# Patient Record
Sex: Male | Born: 1995 | Race: Black or African American | Hispanic: No | Marital: Married | Smoking: Never smoker
Health system: Southern US, Community
[De-identification: ages and names within clinical notes are randomized; demographics above are authoritative.]

---
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
## 2013-10-27 ENCOUNTER — Emergency Department (HOSPITAL_COMMUNITY)
Admission: EM | Admit: 2013-10-27 | Discharge: 2013-10-27 | Disposition: A | Discharge status: Home / Self Care | Payer: Medicaid Other | Attending: Emergency Medicine | Admitting: Emergency Medicine

## 2013-10-27 ENCOUNTER — Emergency Department (HOSPITAL_COMMUNITY): Payer: Medicaid Other

## 2013-10-27 ENCOUNTER — Encounter (HOSPITAL_COMMUNITY): Payer: Self-pay | Admitting: Emergency Medicine

## 2013-10-27 DIAGNOSIS — R109 Unspecified abdominal pain: Secondary | ICD-10-CM

## 2013-10-27 DIAGNOSIS — Z79899 Other long term (current) drug therapy: Secondary | ICD-10-CM | POA: Insufficient documentation

## 2013-10-27 DIAGNOSIS — K5900 Constipation, unspecified: Secondary | ICD-10-CM

## 2013-10-27 DIAGNOSIS — R000 Tachycardia, unspecified: Secondary | ICD-10-CM

## 2013-10-27 LAB — COMPREHENSIVE METABOLIC PANEL
ALBUMIN: 3.4 g/dL — AB (ref 3.5–5.2)
ALK PHOS: 66 U/L (ref 39–117)
ALT: 10 U/L (ref 0–53)
AST: 14 U/L (ref 0–37)
BUN: 11 mg/dL (ref 6–23)
CO2: 25 mEq/L (ref 19–32)
Calcium: 9.5 mg/dL (ref 8.4–10.5)
Chloride: 100 mEq/L (ref 96–112)
Creatinine, Ser: 0.95 mg/dL (ref 0.50–1.35)
GFR calc non Af Amer: >90 mL/min (ref >90)
Glucose, Bld: 117 mg/dL — ABNORMAL HIGH (ref 70–99)
POTASSIUM: 4.5 meq/L (ref 3.7–5.3)
SODIUM: 139 meq/L (ref 137–147)
TOTAL PROTEIN: 8 g/dL (ref 6.0–8.3)
Total Bilirubin: 0.6 mg/dL (ref 0.3–1.2)

## 2013-10-27 LAB — CBC WITH DIFFERENTIAL/PLATELET
BASOS PCT: 0 % (ref 0–1)
Basophils Absolute: 0 10*3/uL (ref 0.0–0.1)
Eosinophils Absolute: 0 10*3/uL (ref 0.0–0.7)
Eosinophils Relative: 1 % (ref 0–5)
HEMATOCRIT: 40.3 % (ref 39.0–52.0)
Hemoglobin: 13.5 g/dL (ref 13.0–17.0)
LYMPHS PCT: 30 % (ref 12–46)
Lymphs Abs: 2.6 10*3/uL (ref 0.7–4.0)
MCH: 27.5 pg (ref 26.0–34.0)
MCHC: 33.5 g/dL (ref 30.0–36.0)
MCV: 82.1 fL (ref 78.0–100.0)
MONO ABS: 0.8 10*3/uL (ref 0.1–1.0)
MONOS PCT: 10 % (ref 3–12)
NEUTROS ABS: 5.2 10*3/uL (ref 1.7–7.7)
Neutrophils Relative %: 59 % (ref 43–77)
Platelets: 271 10*3/uL (ref 150–400)
RBC: 4.91 MIL/uL (ref 4.22–5.81)
RDW: 14.9 % (ref 11.5–15.5)
WBC: 8.8 10*3/uL (ref 4.0–10.5)

## 2013-10-27 LAB — URINALYSIS, ROUTINE W REFLEX MICROSCOPIC
Bilirubin Urine: NEGATIVE
Glucose, UA: NEGATIVE mg/dL
HGB URINE DIPSTICK: NEGATIVE
KETONES UR: NEGATIVE mg/dL
Leukocytes, UA: NEGATIVE
Nitrite: NEGATIVE
PH: 6 (ref 5.0–8.0)
Protein, ur: NEGATIVE mg/dL
SPECIFIC GRAVITY, URINE: 1.023 (ref 1.005–1.030)
Urobilinogen, UA: 1 mg/dL (ref 0.0–1.0)

## 2013-10-27 MED ORDER — IOHEXOL 300 MG/ML  SOLN
25.0000 mL | Freq: Once | INTRAMUSCULAR | Status: AC | PRN
  Administered 2013-10-27: 25 mL via ORAL

## 2013-10-27 MED ORDER — SODIUM CHLORIDE 0.9 % IV BOLUS (SEPSIS)
500.0000 mL | Freq: Once | INTRAVENOUS | Status: AC
  Administered 2013-10-27: 500 mL via INTRAVENOUS

## 2013-10-27 MED ORDER — IOHEXOL 300 MG/ML  SOLN
100.0000 mL | Freq: Once | INTRAMUSCULAR | Status: AC | PRN
  Administered 2013-10-27: 100 mL via INTRAVENOUS

## 2013-10-27 MED ORDER — POLYETHYLENE GLYCOL 3350 17 G PO PACK: 17.0000 g | PACK | Freq: Every day | ORAL | Status: DC

## 2013-10-27 NOTE — Discharge Instructions (Signed)
Abdominal Pain, Adult °Many things can cause abdominal pain. Usually, abdominal pain is not caused by a disease and will improve without treatment. It can often be observed and treated at home. Your health care provider will do a physical exam and possibly order blood tests and X-rays to help determine the seriousness of your pain. However, in many cases, more time must pass before a clear cause of the pain can be found. Before that point, your health care provider may not know if you need more testing or further treatment. °HOME CARE INSTRUCTIONS  °Monitor your abdominal pain for any changes. The following actions may help to alleviate any discomfort you are experiencing: °· Only take over-the-counter or prescription medicines as directed by your health care provider. °· Do not take laxatives unless directed to do so by your health care provider. °· Try a clear liquid diet (broth, tea, or water) as directed by your health care provider. Slowly move to a bland diet as tolerated. °SEEK MEDICAL CARE IF: °· You have unexplained abdominal pain. °· You have abdominal pain associated with nausea or diarrhea. °· You have pain when you urinate or have a bowel movement. °· You experience abdominal pain that wakes you in the night. °· You have abdominal pain that is worsened or improved by eating food. °· You have abdominal pain that is worsened with eating fatty foods. °SEEK IMMEDIATE MEDICAL CARE IF:  °· Your pain does not go away within 2 hours. °· You have a fever. °· You keep throwing up (vomiting). °· Your pain is felt only in portions of the abdomen, such as the right side or the left lower portion of the abdomen. °· You pass bloody or black tarry stools. °MAKE SURE YOU: °· Understand these instructions.   °· Will watch your condition.   °· Will get help right away if you are not doing well or get worse.   °Document Released: 04/18/2005 Document Revised: 04/29/2013 Document Reviewed: 03/18/2013 °[employer]® Patient  Information ©2014 [employer]. °Constipation, Adult °Constipation is when a person has fewer than 3 bowel movements a week; has difficulty having a bowel movement; or has stools that are dry, hard, or larger than normal. As people grow older, constipation is more common. If you try to fix constipation with medicines that make you have a bowel movement (laxatives), the problem may get worse. Long-term laxative use may cause the muscles of the colon to become weak. A low-fiber diet, not taking in enough fluids, and taking certain medicines may make constipation worse. °CAUSES  °· Certain medicines, such as antidepressants, pain medicine, iron supplements, antacids, and water pills.   °· Certain diseases, such as diabetes, irritable bowel syndrome (IBS), thyroid disease, or depression.   °· Not drinking enough water.   °· Not eating enough fiber-rich foods.   °· Stress or travel. °· Lack of physical activity or exercise. °· Not going to the restroom when there is the urge to have a bowel movement. °· Ignoring the urge to have a bowel movement. °· Using laxatives too much. °SYMPTOMS  °· Having fewer than 3 bowel movements a week.   °· Straining to have a bowel movement.   °· Having hard, dry, or larger than normal stools.   °· Feeling full or bloated.   °· Pain in the lower abdomen. °· Not feeling relief after having a bowel movement. °DIAGNOSIS  °Your caregiver will take a medical history and perform a physical exam. Further testing may be done for severe constipation. Some tests may include:  °· A barium enema   X-ray to examine your rectum, colon, and sometimes, your small intestine. °· A sigmoidoscopy to examine your lower colon. °· A colonoscopy to examine your entire colon. °TREATMENT  °Treatment will depend on the severity of your constipation and what is causing it. Some dietary treatments include drinking more fluids and eating more fiber-rich foods. Lifestyle treatments may include regular exercise. If these  diet and lifestyle recommendations do not help, your caregiver may recommend taking over-the-counter laxative medicines to help you have bowel movements. Prescription medicines may be prescribed if over-the-counter medicines do not work.  °HOME CARE INSTRUCTIONS  °· Increase dietary fiber in your diet, such as fruits, vegetables, whole grains, and beans. Limit high-fat and processed sugars in your diet, such as French fries, hamburgers, cookies, candies, and soda.   °· A fiber supplement may be added to your diet if you cannot get enough fiber from foods.   °· Drink enough fluids to keep your urine clear or pale yellow.   °· Exercise regularly or as directed by your caregiver.   °· Go to the restroom when you have the urge to go. Do not hold it. °· Only take medicines as directed by your caregiver. Do not take other medicines for constipation without talking to your caregiver first. °SEEK IMMEDIATE MEDICAL CARE IF:  °· You have bright red blood in your stool.   °· Your constipation lasts for more than 4 days or gets worse.   °· You have abdominal or rectal pain.   °· You have thin, pencil-like stools. °· You have unexplained weight loss. °MAKE SURE YOU:  °· Understand these instructions. °· Will watch your condition. °· Will get help right away if you are not doing well or get worse. °Document Released: 04/06/2004 Document Revised: 10/01/2011 Document Reviewed: 04/20/2013 °[employer]® Patient Information ©2014 [employer]. ° °

## 2013-10-27 NOTE — ED Notes (Signed)
Pt to CT

## 2013-10-27 NOTE — ED Provider Notes (Signed)
CSN: [id number]     Arrival date & time 10/27/13  1238 History   First MD Initiated Contact with Patient 10/27/13 1707     Chief Complaint  Patient presents with  . Abdominal Pain     (Consider location/radiation/quality/duration/timing/severity/associated sxs/prior Treatment) Patient is a 18 y.o. male presenting with abdominal pain. The history is provided by the patient.  Abdominal Pain Associated symptoms: no chest pain, no diarrhea, no nausea, no shortness of breath and no vomiting    patient has lower abdominal pain for last 2 days. Worse with movement. No nausea vomiting diarrhea. No fevers. No dysuria. No change in bowel habits. No trauma, however states he did workout at gym today. He's not specifically to abdominal muscles, but states he was working on free weights. He's not had pain like this before.  History reviewed. No pertinent past medical history. History reviewed. No pertinent past surgical history. No family history on file. History  Substance Use Topics  . Smoking status: Never Smoker   . Smokeless tobacco: Not on file  . Alcohol Use: No    Review of Systems  Constitutional: Negative for activity change and appetite change.  Eyes: Negative for pain.  Respiratory: Negative for chest tightness and shortness of breath.   Cardiovascular: Negative for chest pain and leg swelling.  Gastrointestinal: Positive for abdominal pain. Negative for nausea, vomiting and diarrhea.  Genitourinary: Negative for flank pain.  Musculoskeletal: Negative for back pain and neck stiffness.  Skin: Negative for rash.  Neurological: Negative for weakness, numbness and headaches.  Psychiatric/Behavioral: Negative for behavioral problems.      Allergies  Review of patient's allergies indicates no known allergies.  Home Medications   Current Outpatient Rx  Name  Route  Sig  Dispense  Refill  . ibuprofen (ADVIL,MOTRIN) 200 MG tablet   Oral   Take 200 mg by mouth every 6 (six) hours  as needed for moderate pain.         . polyethylene glycol (MIRALAX / GLYCOLAX) packet   Oral   Take 17 g by mouth daily.   7 each   0    BP 125/79  Pulse 118  Temp(Src) 98.4 F (36.9 C) (Oral)  Resp 23  Ht 6\' 2"  (1.88 m)  Wt 156 lb 6.4 oz (70.943 kg)  BMI 20.07 kg/m2  SpO2 97% Physical Exam  Constitutional: He is oriented to person, place, and time. He appears well-developed and well-nourished.  HENT:  Head: Normocephalic.  Eyes: Pupils are equal, round, and reactive to light.  Cardiovascular:  tachycardia  Pulmonary/Chest: Effort normal and breath sounds normal.  Abdominal: Soft.  Moderate lower abdominal tenderness in the midline. No hernias palpated  Musculoskeletal: Normal range of motion.  Neurological: He is alert and oriented to person, place, and time.  Skin: Skin is warm.    ED Course  Procedures (including critical care time) Labs Review Labs Reviewed  COMPREHENSIVE METABOLIC PANEL - Abnormal; Notable for the following:    Glucose, Bld 117 (*)    Albumin 3.4 (*)    All other components within normal limits  CBC WITH DIFFERENTIAL  URINALYSIS, ROUTINE W REFLEX MICROSCOPIC   Imaging Review Ct Abdomen Pelvis W Contrast  10/27/2013   CLINICAL DATA:  Bilateral lower abdominal pain.  EXAM: CT ABDOMEN AND PELVIS WITH CONTRAST  TECHNIQUE: Multidetector CT imaging of the abdomen and pelvis was performed using the standard protocol following bolus administration of intravenous contrast.  CONTRAST:  100 mL OMNIPAQUE IOHEXOL 300 MG/ML  SOLN  COMPARISON:  None.  FINDINGS: Lung bases are clear.  No pleural or pericardial effusion.  The adrenal glands, liver, gallbladder, spleen, pancreas and kidneys appear normal. The appendix appears normal. There is a large volume of stool in the colon but the colon is otherwise unremarkable. Stomach and small bowel appear normal. No lymphadenopathy or fluid. No focal bony abnormality.  IMPRESSION: No acute finding.  Large colonic stool  burden.   Electronically Signed   By: Drusilla Kanner M.D.   On: 10/27/2013 18:33     EKG Interpretation None      MDM   Final diagnoses:  Abdominal pain  Constipation  Tachycardia    Patient with abdominal pain. Lower abdomen. Laboratory reassuring. Moderate tenderness a CT scan was done. Only showed large colonic stool burden. Will discharge home. Also has a tachycardia. Appears sinus on the monitor. It is improved somewhat during his stay. Will discharge home.    Juliet Rude. Rubin Payor, MD 10/27/13 1610

## 2013-10-27 NOTE — ED Notes (Signed)
Pt reports lower abd pain since Sunday, increases with movement. Denies any urinary symptoms or n/v/d.

## 2013-11-14 ENCOUNTER — Emergency Department (HOSPITAL_BASED_OUTPATIENT_CLINIC_OR_DEPARTMENT_OTHER)
Admission: EM | Admit: 2013-11-14 | Discharge: 2013-11-14 | Disposition: A | Discharge status: Home / Self Care | Payer: Medicaid Other | Attending: Emergency Medicine | Admitting: Emergency Medicine

## 2013-11-14 ENCOUNTER — Encounter (HOSPITAL_BASED_OUTPATIENT_CLINIC_OR_DEPARTMENT_OTHER): Payer: Self-pay | Admitting: Emergency Medicine

## 2013-11-14 DIAGNOSIS — K5900 Constipation, unspecified: Secondary | ICD-10-CM | POA: Insufficient documentation

## 2013-11-14 DIAGNOSIS — X58XXXA Exposure to other specified factors, initial encounter: Secondary | ICD-10-CM | POA: Insufficient documentation

## 2013-11-14 DIAGNOSIS — S76219A Strain of adductor muscle, fascia and tendon of unspecified thigh, initial encounter: Secondary | ICD-10-CM

## 2013-11-14 DIAGNOSIS — Y929 Unspecified place or not applicable: Secondary | ICD-10-CM | POA: Insufficient documentation

## 2013-11-14 DIAGNOSIS — Y939 Activity, unspecified: Secondary | ICD-10-CM | POA: Insufficient documentation

## 2013-11-14 DIAGNOSIS — Z79899 Other long term (current) drug therapy: Secondary | ICD-10-CM | POA: Insufficient documentation

## 2013-11-14 DIAGNOSIS — IMO0002 Reserved for concepts with insufficient information to code with codable children: Secondary | ICD-10-CM | POA: Insufficient documentation

## 2013-11-14 LAB — URINALYSIS, ROUTINE W REFLEX MICROSCOPIC
GLUCOSE, UA: NEGATIVE mg/dL
Hgb urine dipstick: NEGATIVE
Ketones, ur: 15 mg/dL — AB
Leukocytes, UA: NEGATIVE
Nitrite: NEGATIVE
Protein, ur: NEGATIVE mg/dL
Specific Gravity, Urine: 1.036 — ABNORMAL HIGH (ref 1.005–1.030)
Urobilinogen, UA: 1 mg/dL (ref 0.0–1.0)
pH: 6 (ref 5.0–8.0)

## 2013-11-14 NOTE — ED Notes (Signed)
Reports right side groin pain with running and situps during PT for [employer]

## 2013-11-14 NOTE — ED Provider Notes (Signed)
CSN: [id number]     Arrival date & time 11/14/13  1825 History  This chart was scribed for Rolan BuccoMelanie Alzena Gerber, MD by Beverly MilchJ Harrison Collins, ED Scribe. This patient was seen in room MH11/MH11 and the patient's care was started at 6:48 PM.    Chief Complaint  Patient presents with  . Groin Pain    The history is provided by the patient. No language interpreter was used.  HPI Comments: Guy Sutton is a 18 y.o. male who presents to the Emergency Department complaining of groin pain that began today. Pt reports he had constipation last week but is unsure that if it was related. He notices the pain worsens with movements such as sit ups and running. He states that it only hurts with these activities.  Pt denies lumps around the groin, abdominal pain, dysuria, and penile discharge.Pt denies h/o similar pain and hernias. Pt reports no pain with his BM today. Pt reports he would like a note for his physical training.   History reviewed. No pertinent past medical history. History reviewed. No pertinent past surgical history. No family history on file. History  Substance Use Topics  . Smoking status: Never Smoker   . Smokeless tobacco: Never Used  . Alcohol Use: No    Review of Systems  Constitutional: Negative for fever, chills, diaphoresis and fatigue.  HENT: Negative for congestion, rhinorrhea and sneezing.   Eyes: Negative.   Respiratory: Negative for cough, chest tightness and shortness of breath.   Cardiovascular: Negative for chest pain and leg swelling.  Gastrointestinal: Negative for nausea, vomiting, abdominal pain, diarrhea and blood in stool.  Genitourinary: Negative for frequency, hematuria, flank pain and difficulty urinating.  Musculoskeletal: Negative for arthralgias and back pain.       Right sided groin pain  Skin: Negative for rash.  Neurological: Negative for dizziness, speech difficulty, weakness, numbness and headaches.      Allergies  Review of patient's allergies  indicates no known allergies.  Home Medications   Prior to Admission medications   Medication Sig Start Date End Date Taking? Authorizing Provider  ibuprofen (ADVIL,MOTRIN) 200 MG tablet Take 200 mg by mouth every 6 (six) hours as needed for moderate pain.    Historical Provider, MD  polyethylene glycol (MIRALAX / GLYCOLAX) packet Take 17 g by mouth daily. 10/27/13   Juliet RudeNathan R. Pickering, MD   Triage Vitals: BP 129/70  Pulse 94  Temp(Src) 98.6 F (37 C) (Oral)  Resp 18  Ht 6\' 2"  (1.88 m)  Wt 154 lb (69.854 kg)  BMI 19.76 kg/m2  SpO2 100%  Physical Exam  Nursing note and vitals reviewed. Constitutional: He is oriented to person, place, and time. He appears well-developed and well-nourished.  HENT:  Head: Normocephalic and atraumatic.  Eyes: Pupils are equal, round, and reactive to light.  Neck: Normal range of motion. Neck supple.  Cardiovascular: Normal rate, regular rhythm and normal heart sounds.   Pulmonary/Chest: Effort normal and breath sounds normal. No respiratory distress. He has no wheezes. He has no rales. He exhibits no tenderness.  Abdominal: Soft. Bowel sounds are normal. There is no tenderness. There is no rebound and no guarding.  Musculoskeletal: Normal range of motion. He exhibits tenderness (right inguinal area). He exhibits no edema.  No pain to the testicle or epididymus, no hernias palpated, no lymphadenopathy  Lymphadenopathy:    He has no cervical adenopathy.  Neurological: He is alert and oriented to person, place, and time.  Skin: Skin is warm and  dry. No rash noted.  Psychiatric: He has a normal mood and affect.    ED Course  Procedures (including critical care time)  DIAGNOSTIC STUDIES: Oxygen Saturation is 100% on RA, normal by my interpretation.    COORDINATION OF CARE: 6:54 PM- Pt advised of plan for treatment and pt agrees.    Labs Review Labs Reviewed  URINALYSIS, ROUTINE W REFLEX MICROSCOPIC - Abnormal; Notable for the following:     Specific Gravity, Urine 1.036 (*)    Bilirubin Urine SMALL (*)    Ketones, ur 15 (*)    All other components within normal limits    Imaging Review No results found.   EKG Interpretation None      MDM   Final diagnoses:  Groin strain    PT with right groin pain, likely groin strain.  No hernias palpated.  No testicular pain.  Will d/c refer to urology if symptoms continue.  Advised to return if he develops a painful knot consistent with hernia  I personally performed the services described in this documentation, which was scribed in my presence.  The recorded information has been reviewed and considered.     Rolan BuccoMelanie Jovane Foutz, MD 11/14/13 [phone]

## 2013-11-14 NOTE — Discharge Instructions (Signed)
Groin Strain  A groin strain (also called a groin pull) is an injury to the muscles or tendon on the upper inner part of the thigh. These muscles are called the adductor muscles or groin muscles. They are responsible for moving the leg across the body. A muscle strain occurs when a muscle is overstretched and some muscle fibers are torn. A groin strain can range from mild to severe depending on how many muscle fibers are affected and whether the muscle fibers are partially or completely torn.   Groin strains usually occur during exercise or participation in sports. The injury often happens when a sudden, violent force is placed on a muscle, stretching the muscle too far. A strain is more likely to occur when your muscles are not warmed up or if you are not properly conditioned. Depending on the severity of the groin strain, recovery time may vary from a few weeks to several weeks. Severe injuries often require 4 6 weeks for recovery. In these cases, complete healing can take 4 5 months.   CAUSES    Stretching the groin muscles too far or too suddenly, often during side-to-side motion with an abrupt change in direction.   Putting repeated stress on the groin muscles over a long period of time.   Performing vigorous activity without properly stretching the groin muscles beforehand.  SYMPTOMS    Pain and tenderness in the groin area. This begins as sharp pain and persists as a dull ache.   Popping or snapping feeling when the injury occurs (for severe strains).   Swelling or bruising.   Muscle spasms.   Weakness in the leg.   Stiffness in the groin area with decreased ability to move the affected muscles.  DIAGNOSIS   Your caregiver will perform a physical exam to diagnose a groin strain. You will be asked about your symptoms and how the injury occurred. X-rays are sometimes needed to rule out a broken bone or cartilage problems. Your caregiver may order a CT scan or MRI if a complete muscle tear is  suspected.  TREATMENT   A groin strain will often heal on its own. Your caregiver may prescribe medicines to help manage pain and swelling (anti-inflammatory medicine). You may be told to use crutches for the first few days to minimize your pain.  HOME CARE INSTRUCTIONS    Rest. Do not use the strained muscle if it causes pain.   Put ice on the injured area.   Put ice in a plastic bag.   Place a towel between your skin and the bag.   Leave the ice on for 15 20 minutes, every 2 3 hours. Do this for the first 2 days after the injury.   Only take over-the-counter or prescription medicines as directed by your caregiver.   Wrap the injured area with an elastic bandage as directed by your caregiver.   Keep the injured leg raised (elevated).   Walk, stretch, and perform range-of-motion exercises to improve blood flow to the injured area. Only perform these activities if you can do so without any pain.  To prevent muscle strains:   Warm up before exercise.   Develop proper conditioning and strength in the groin muscles.  SEEK IMMEDIATE MEDICAL CARE IF:    You have increased pain or swelling in the affected area.    Your symptoms are not improving or are getting worse.  MAKE SURE YOU:    Understand these instructions.   Will watch your condition.     Will get help right away if you are not doing well or get worse.  Document Released: 03/06/2004 Document Revised: 06/25/2012 Document Reviewed: 03/12/2012  [employer] Patient Information 2014 [employer].

## 2015-12-25 ENCOUNTER — Inpatient Hospital Stay (HOSPITAL_COMMUNITY)
Admission: EM | Admit: 2015-12-25 | Discharge: 2015-12-28 | DRG: 872 | Disposition: A | Discharge status: Home / Self Care | Payer: Medicaid Other | Attending: Internal Medicine | Admitting: Internal Medicine

## 2015-12-25 ENCOUNTER — Emergency Department (HOSPITAL_COMMUNITY): Payer: Medicaid Other

## 2015-12-25 ENCOUNTER — Encounter (HOSPITAL_COMMUNITY): Payer: Self-pay

## 2015-12-25 DIAGNOSIS — K1120 Sialoadenitis, unspecified: Secondary | ICD-10-CM | POA: Diagnosis present

## 2015-12-25 DIAGNOSIS — K115 Sialolithiasis: Secondary | ICD-10-CM | POA: Diagnosis present

## 2015-12-25 DIAGNOSIS — A419 Sepsis, unspecified organism: Secondary | ICD-10-CM | POA: Diagnosis present

## 2015-12-25 DIAGNOSIS — J029 Acute pharyngitis, unspecified: Secondary | ICD-10-CM | POA: Diagnosis not present

## 2015-12-25 LAB — CBC WITH DIFFERENTIAL/PLATELET
BASOS PCT: 0 %
Basophils Absolute: 0 10*3/uL (ref 0.0–0.1)
Eosinophils Absolute: 0 10*3/uL (ref 0.0–0.7)
Eosinophils Relative: 0 %
HEMATOCRIT: 48.6 % (ref 39.0–52.0)
HEMOGLOBIN: 16.9 g/dL (ref 13.0–17.0)
LYMPHS PCT: 14 %
Lymphs Abs: 1.4 10*3/uL (ref 0.7–4.0)
MCH: 27.9 pg (ref 26.0–34.0)
MCHC: 34.8 g/dL (ref 30.0–36.0)
MCV: 80.3 fL (ref 78.0–100.0)
MONOS PCT: 14 %
Monocytes Absolute: 1.4 10*3/uL — ABNORMAL HIGH (ref 0.1–1.0)
NEUTROS ABS: 7 10*3/uL (ref 1.7–7.7)
NEUTROS PCT: 72 %
Platelets: 215 10*3/uL (ref 150–400)
RBC: 6.05 MIL/uL — ABNORMAL HIGH (ref 4.22–5.81)
RDW: 15.1 % (ref 11.5–15.5)
WBC: 9.8 10*3/uL (ref 4.0–10.5)

## 2015-12-25 LAB — I-STAT CHEM 8, ED
BUN: 11 mg/dL (ref 6–20)
CREATININE: 1.2 mg/dL (ref 0.61–1.24)
Calcium, Ion: 1.06 mmol/L — ABNORMAL LOW (ref 1.12–1.23)
Chloride: 100 mmol/L — ABNORMAL LOW (ref 101–111)
GLUCOSE: 100 mg/dL — AB (ref 65–99)
HCT: 55 % — ABNORMAL HIGH (ref 39.0–52.0)
Hemoglobin: 18.7 g/dL — ABNORMAL HIGH (ref 13.0–17.0)
POTASSIUM: 4 mmol/L (ref 3.5–5.1)
Sodium: 138 mmol/L (ref 135–145)
TCO2: 23 mmol/L (ref 0–100)

## 2015-12-25 LAB — RAPID STREP SCREEN (MED CTR MEBANE ONLY): Streptococcus, Group A Screen (Direct): NEGATIVE

## 2015-12-25 MED ORDER — HEPARIN SODIUM (PORCINE) 5000 UNIT/ML IJ SOLN
5000.0000 [IU] | Freq: Three times a day (TID) | INTRAMUSCULAR | Status: DC
  Administered 2015-12-26 – 2015-12-28 (×8): 5000 [IU] via SUBCUTANEOUS
  Filled 2015-12-25 (×11): qty 1

## 2015-12-25 MED ORDER — FENTANYL CITRATE (PF) 100 MCG/2ML IJ SOLN
100.0000 ug | Freq: Once | INTRAMUSCULAR | Status: AC
  Administered 2015-12-25: 100 ug via INTRAVENOUS
  Filled 2015-12-25: qty 2

## 2015-12-25 MED ORDER — ACETAMINOPHEN 325 MG PO TABS
650.0000 mg | ORAL_TABLET | Freq: Four times a day (QID) | ORAL | Status: DC | PRN
  Administered 2015-12-26: 650 mg via ORAL
  Filled 2015-12-25: qty 2

## 2015-12-25 MED ORDER — CLINDAMYCIN PHOSPHATE 900 MG/50ML IV SOLN
900.0000 mg | Freq: Once | INTRAVENOUS | Status: AC
  Administered 2015-12-25: 900 mg via INTRAVENOUS
  Filled 2015-12-25: qty 50

## 2015-12-25 MED ORDER — SODIUM CHLORIDE 0.9 % IV SOLN
1000.0000 mL | Freq: Once | INTRAVENOUS | Status: AC
  Administered 2015-12-25: 1000 mL via INTRAVENOUS

## 2015-12-25 MED ORDER — SODIUM CHLORIDE 0.9 % IV SOLN
Freq: Once | INTRAVENOUS | Status: AC
  Administered 2015-12-25: 18:00:00 via INTRAVENOUS

## 2015-12-25 MED ORDER — SODIUM CHLORIDE 0.9 % IV SOLN
INTRAVENOUS | Status: DC
  Administered 2015-12-25 – 2015-12-27 (×5): via INTRAVENOUS

## 2015-12-25 MED ORDER — IOPAMIDOL (ISOVUE-300) INJECTION 61%
75.0000 mL | Freq: Once | INTRAVENOUS | Status: AC | PRN
Start: 2015-12-25 — End: 2015-12-25
  Administered 2015-12-25: 75 mL via INTRAVENOUS

## 2015-12-25 MED ORDER — FENTANYL CITRATE (PF) 100 MCG/2ML IJ SOLN
50.0000 ug | INTRAMUSCULAR | Status: DC | PRN
  Administered 2015-12-26 (×4): 50 ug via INTRAVENOUS
  Filled 2015-12-25 (×4): qty 2

## 2015-12-25 MED ORDER — SODIUM CHLORIDE 0.9 % IV BOLUS (SEPSIS): 1000.0000 mL | Freq: Once | INTRAVENOUS | Status: DC

## 2015-12-25 MED ORDER — CLINDAMYCIN PHOSPHATE 600 MG/50ML IV SOLN
600.0000 mg | Freq: Four times a day (QID) | INTRAVENOUS | Status: DC
  Administered 2015-12-26 – 2015-12-28 (×8): 600 mg via INTRAVENOUS
  Filled 2015-12-25 (×12): qty 50

## 2015-12-25 NOTE — ED Notes (Signed)
Patient transported to CT 

## 2015-12-25 NOTE — H&P (Signed)
History and Physical    Guy BertholdRahim C Prisco ZOX:[id number] DOB: 1996-02-24 DOA: 12/25/2015   PCP: No primary care provider on file. Chief Complaint:  Chief Complaint  Patient presents with  . Sore Throat  . Tachycardia    HPI: Guy Sutton is a 20 y.o. male with medical history significant of previously health.  Patient presents to the ED with c/o 2-3 day history of sore throat, pain with swallowing.  Associated fever, nothing makes symptoms better.  ED Course: Patient found to have submandibular duct stone with obstruction and fluid collection behind stone.  Swelling in this area, not really a lot of airway effacement on the CT scan.  Dr. Suszanne Connerseoh was called by EDP, says he will see patient tomorrow AM, patient may eat, and to give clinda.  Review of Systems: As per HPI otherwise 10 point review of systems negative.    History reviewed. No pertinent past medical history.  History reviewed. No pertinent past surgical history.   reports that he has never smoked. He has never used smokeless tobacco. He reports that he does not drink alcohol or use illicit drugs.  No Known Allergies  History reviewed. No pertinent family history.   Prior to Admission medications   Not on File    Physical Exam: Filed Vitals:   12/25/15 2115 12/25/15 2145 12/25/15 2216 12/25/15 2219  BP: 139/99 141/105  131/97  Pulse: 137 140 137 136  Temp:      TempSrc:      Resp:   15 20  SpO2: 100% 100% 97% 94%      Constitutional: NAD, calm, comfortable Eyes: PERRL, lids and conjunctivae normal ENMT: Mucous membranes are moist. Posterior pharynx clear of any exudate or lesions.Normal dentition.  Neck: normal, supple, no masses, no thyromegaly Respiratory: clear to auscultation bilaterally, no wheezing, no crackles. Normal respiratory effort. No accessory muscle use.  Cardiovascular: Regular rate and rhythm, no murmurs / rubs / gallops. No extremity edema. 2+ pedal pulses. No carotid bruits.  Abdomen: no  tenderness, no masses palpated. No hepatosplenomegaly. Bowel sounds positive.  Musculoskeletal: no clubbing / cyanosis. No joint deformity upper and lower extremities. Good ROM, no contractures. Normal muscle tone.  Skin: no rashes, lesions, ulcers. No induration Neurologic: CN 2-12 grossly intact. Sensation intact, DTR normal. Strength 5/5 in all 4.  Psychiatric: Normal judgment and insight. Alert and oriented x 3. Normal mood.    Labs on Admission: I have personally reviewed following labs and imaging studies  CBC:  Recent Labs Lab 12/25/15 1805 12/25/15 1816  WBC 9.8  --   NEUTROABS 7.0  --   HGB 16.9 18.7*  HCT 48.6 55.0*  MCV 80.3  --   PLT 215  --    Basic Metabolic Panel:  Recent Labs Lab 12/25/15 1816  NA 138  K 4.0  CL 100*  GLUCOSE 100*  BUN 11  CREATININE 1.20   GFR: CrCl cannot be calculated (Unknown ideal weight.). Liver Function Tests: No results for input(s): AST, ALT, ALKPHOS, BILITOT, PROT, ALBUMIN in the last 168 hours. No results for input(s): LIPASE, AMYLASE in the last 168 hours. No results for input(s): AMMONIA in the last 168 hours. Coagulation Profile: No results for input(s): INR, PROTIME in the last 168 hours. Cardiac Enzymes: No results for input(s): CKTOTAL, CKMB, CKMBINDEX, TROPONINI in the last 168 hours. BNP (last 3 results) No results for input(s): PROBNP in the last 8760 hours. HbA1C: No results for input(s): HGBA1C in the last 72 hours. CBG:  No results for input(s): GLUCAP in the last 168 hours. Lipid Profile: No results for input(s): CHOL, HDL, LDLCALC, TRIG, CHOLHDL, LDLDIRECT in the last 72 hours. Thyroid Function Tests: No results for input(s): TSH, T4TOTAL, FREET4, T3FREE, THYROIDAB in the last 72 hours. Anemia Panel: No results for input(s): VITAMINB12, FOLATE, FERRITIN, TIBC, IRON, RETICCTPCT in the last 72 hours. Urine analysis:    Component Value Date/Time   COLORURINE YELLOW 11/14/2013 1933   APPEARANCEUR CLEAR  11/14/2013 1933   LABSPEC 1.036* 11/14/2013 1933   PHURINE 6.0 11/14/2013 1933   GLUCOSEU NEGATIVE 11/14/2013 1933   HGBUR NEGATIVE 11/14/2013 1933   BILIRUBINUR SMALL* 11/14/2013 1933   KETONESUR 15* 11/14/2013 1933   PROTEINUR NEGATIVE 11/14/2013 1933   UROBILINOGEN 1.0 11/14/2013 1933   NITRITE NEGATIVE 11/14/2013 1933   LEUKOCYTESUR NEGATIVE 11/14/2013 1933   Sepsis Labs: (procalcitonin:4,lacticidven:4) ) Recent Results (from the past 240 hour(s))  Rapid strep screen (not at [facility])     Status: None   Collection Time: 12/25/15  6:10 PM  Result Value Ref Range Status   Streptococcus, Group A Screen (Direct) NEGATIVE NEGATIVE Corrected    Comment: (NOTE) A Rapid Antigen test may result negative if the antigen level in the sample is below the detection level of this test. The FDA has not cleared this test as a stand-alone test therefore the rapid antigen negative result has reflexed to a Group A Strep culture. CORRECTED ON 06/04 AT 2104: PREVIOUSLY REPORTED AS NEGATIVE      Radiological Exams on Admission: Ct Soft Tissue Neck W Contrast  12/25/2015  CLINICAL DATA:  20 year old male with fever and sore throat for 3 days. Difficulty swallowing food and drink. Throat swelling and pain. Initial encounter. EXAM: CT NECK WITH CONTRAST TECHNIQUE: Multidetector CT imaging of the neck was performed using the standard protocol following the bolus administration of intravenous contrast. CONTRAST:  75mL ISOVUE-300 IOPAMIDOL (ISOVUE-300) INJECTION 61% COMPARISON:  None. FINDINGS: Pharynx and larynx: Edema and asymmetric soft tissue swelling from the left glossal tonsillar sulcus to the left piriform sinus, this appears secondary to the severe abnormality of the left sublingual space and submandibular gland (see the neck section below). The larynx otherwise appears normal. No significant swelling of the epiglottis. There is superimposed palatine tonsil enlargement and hyper enhancement. No  tonsillar abscess. The nasopharynx has a more normal appearance for patient age. The superior parapharyngeal spaces are normal. The retropharyngeal space is normal. Salivary glands: The sublingual space and especially the left submandibular space are abnormal with severe soft tissue swelling and edema. The etiology appears to be a large 11 x 10 mm obstructing sialolith of the left submandibular duct located near the junction of the left submandibular and sublingual spaces (series 2, image 47). There is a 2 cm oval fluid collection and/or severe enlargement of the duct just posterior to the calculus. Ductal ectasia is seen to extend into the hilum of the left submandibular gland which appears mildly enlarged and hyper enhancing. There also appears to be some hyper enhancement and asymmetric enlargement of the left sublingual gland. There is mild rightward midline shift at the level of the sublingual space. No definite abnormality of the right submandibular gland. Both parotid glands appear normal. No other sialolithiasis identified. Thyroid: Negative. Lymph nodes: Mild hyper enhancing and asymmetrically enlarged left level 2, level 3, and level 5 lymph nodes. More bland appearance of the level 1 nodes. No right side lymphadenopathy. No cystic or necrotic nodes. Vascular: Major vascular structures in the neck  and at the skullbase are patent including the left internal jugular vein. The left vertebral artery is dominant. Limited intracranial: Negative. Visualized orbits: Negative. Mastoids and visualized paranasal sinuses: Hyperplastic paranasal sinuses. Bilateral ethmoid, maxillary, and left sphenoid sinus mucosal thickening. Bilateral hyperplastic petrous apex and left sphenoid wing air cells. The tympanic cavities and mastoids are clear. Skeleton: There are unerupted and possibly supernumerary bilateral mandibular teeth at the level of the bicuspid (series 4, image 65). No acute dental process identified. Mild  scoliosis. No acute osseous abnormality identified. Upper chest: Negative lung apices. Negative visualized superior mediastinum. IMPRESSION: 1. Severe obstruction of the left submandibular gland by a large 10 x 11 mm sialolith with severe secondary inflammation of the sublingual and left submandibular spaces. Recommend ENT Consultation. 2. A 2 cm fluid collection posterior to the sialolith at least in part represents the obstructed left submandibular duct. But abscess or superinfection here is difficult to exclude. 3. Secondary edema and soft tissue swelling along the lateral wall of the left hypopharynx. Suggestion also of bilateral palatine tonsil hyper enhancement raising the possibility of superimposed tonsillitis. 4. No other fluid collection in the neck. There is reactive left side lymphadenopathy. 5. Incidental hyperplastic paranasal sinuses with mild mucosal thickening. 6. 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 Electronically Signed   By: Odessa Fleming M.D.   On: 12/25/2015 20:26    EKG: Independently reviewed.  Assessment/Plan Principal Problem:   Submandibular gland infection Active Problems:   Sepsis (HCC)   Submandibular gland infection and sepsis -  Giving 3rd L IVF bolus  IVF at 125 cc/hr  As per Dr. Avel Sensor instructions to EDP:    Clindamycin   Usually managed non-surgically: he states that patient may eat   putting on heparin for DVT ppx  Tele monitor for severe tachycardia  Tylenol PRN fever   DVT prophylaxis: Heparin Dickinson Code Status: Full Family Communication: Family at bedside Consults called: Dr. Suszanne Conners to see patient in AM Admission status: Admit to inpatient   Hillary Bow DO [facility] Pager [phone] from 7PM-7AM  If 7AM-7PM, please contact the day physician for the patient [email or web address] Password [facility]  12/25/2015, 10:47 PM

## 2015-12-25 NOTE — ED Notes (Signed)
[facility]. Pt ready for transport.

## 2015-12-25 NOTE — ED Notes (Signed)
Pt with fever and sore throat x 2-3 days.  Pt having difficulty swallowing food or drink.  Painful.  Throat is swollen.  Pt heart rate elevated in triage.  No chest pain or discomfort.

## 2015-12-25 NOTE — ED Provider Notes (Addendum)
CSN: [id number]     Arrival date & time 12/25/15  1731 History   First MD Initiated Contact with Patient 12/25/15 1758     Chief Complaint  Patient presents with  . Sore Throat  . Tachycardia     (Consider location/radiation/quality/duration/timing/severity/associated sxs/prior Treatment) HPI complains of left-sided sore throat for the past 2 days., Mild at present Pain is worse with swallowing, not improved with anything associated symptoms include fever. No treatment prior to coming here shortness of breath no nausea or vomiting no other associated symptoms  History reviewed. No pertinent past medical history. Past medical history negative History reviewed. No pertinent past surgical history. History reviewed. No pertinent family history. Social History  Substance Use Topics  . Smoking status: Never Smoker   . Smokeless tobacco: Never Used  . Alcohol Use: No  No tobacco occasional alcohol no illicit drug use   Review of Systems  Constitutional: Positive for fever.  HENT: Positive for sore throat.   Respiratory: Negative.   Cardiovascular: Negative.   Gastrointestinal: Negative.   Musculoskeletal: Negative.   Skin: Negative.   Neurological: Negative.   Psychiatric/Behavioral: Negative.   All other systems reviewed and are negative.     Allergies  Review of patient's allergies indicates no known allergies.  Home Medications   Prior to Admission medications   Medication Sig Start Date End Date Taking? Authorizing Provider  ibuprofen (ADVIL,MOTRIN) 200 MG tablet Take 200 mg by mouth every 6 (six) hours as needed for moderate pain.    Historical Provider, MD  polyethylene glycol (MIRALAX / GLYCOLAX) packet Take 17 g by mouth daily. 10/27/13   Benjiman Core, MD   BP 136/87 mmHg  Pulse 160  Temp(Src) 100.7 F (38.2 C) (Oral)  Resp 20  SpO2 100% Physical Exam  Constitutional: He appears well-developed and well-nourished. No distress.  HENT:  Head: Normocephalic and  atraumatic.  Right Ear: External ear normal.  Left Ear: External ear normal.  Oropharynx reddened. Uvula midline tonsils large bilaterally, not touching. No exudate  Eyes: Conjunctivae are normal. Pupils are equal, round, and reactive to light.  Neck: Neck supple. No tracheal deviation present. No thyromegaly present.  Tender anterior cervical lymph node left side  Cardiovascular: Normal rate.   No murmur heard. Tachycardic  Pulmonary/Chest: Effort normal and breath sounds normal.  Abdominal: Soft. Bowel sounds are normal. He exhibits no distension. There is no tenderness.  Musculoskeletal: Normal range of motion. He exhibits no edema or tenderness.  Lymphadenopathy:    He has cervical adenopathy.  Neurological: He is alert. Coordination normal.  Skin: Skin is warm and dry. No rash noted.  Psychiatric: He has a normal mood and affect.  Nursing note and vitals reviewed.   ED Course  Procedures (including critical care time) Labs Review Labs Reviewed - No data to display  Imaging Review No results found. I have personally reviewed and evaluated these images and lab results as part of my medical decision-making.   EKG Interpretation   Date/Time:  Sunday December 25 2015 17:55:16 EDT Ventricular Rate:  157 PR Interval:  73 QRS Duration: 79 QT Interval:  266 QTC Calculation: 430 R Axis:   71 Text Interpretation:  Sinus tachycardia RSR' in V1 or V2, right VCD or RVH  No old tracing to compare Confirmed by Ethelda Chick  MD, Celica Kotowski [phone]) on  12/25/2015 6:12:20 PM     Declines pain medicine . 9:45 PM patient resting comfortably. Handling secretions well Remains tachycardic after treatment with intravenous fluids.Remains tachycardic  to 1 40 bpm, sinus tachycardia Results for orders placed or performed during the hospital encounter of 12/25/15  Rapid strep screen (not at [facility])  Result Value Ref Range   Streptococcus, Group A Screen (Direct) NEGATIVE NEGATIVE  CBC with  Differential/Platelet  Result Value Ref Range   WBC 9.8 4.0 - 10.5 K/uL   RBC 6.05 (H) 4.22 - 5.81 MIL/uL   Hemoglobin 16.9 13.0 - 17.0 g/dL   HCT 16.1 09.6 - 04.5 %   MCV 80.3 78.0 - 100.0 fL   MCH 27.9 26.0 - 34.0 pg   MCHC 34.8 30.0 - 36.0 g/dL   RDW 40.9 81.1 - 91.4 %   Platelets 215 150 - 400 K/uL   Neutrophils Relative % 72 %   Neutro Abs 7.0 1.7 - 7.7 K/uL   Lymphocytes Relative 14 %   Lymphs Abs 1.4 0.7 - 4.0 K/uL   Monocytes Relative 14 %   Monocytes Absolute 1.4 (H) 0.1 - 1.0 K/uL   Eosinophils Relative 0 %   Eosinophils Absolute 0.0 0.0 - 0.7 K/uL   Basophils Relative 0 %   Basophils Absolute 0.0 0.0 - 0.1 K/uL  I-stat chem 8, ed  Result Value Ref Range   Sodium 138 135 - 145 mmol/L   Potassium 4.0 3.5 - 5.1 mmol/L   Chloride 100 (L) 101 - 111 mmol/L   BUN 11 6 - 20 mg/dL   Creatinine, Ser 7.82 0.61 - 1.24 mg/dL   Glucose, Bld 956 (H) 65 - 99 mg/dL   Calcium, Ion 2.13 (L) 1.12 - 1.23 mmol/L   TCO2 23 0 - 100 mmol/L   Hemoglobin 18.7 (H) 13.0 - 17.0 g/dL   HCT 08.6 (H) 57.8 - 46.9 %   Ct Soft Tissue Neck W Contrast  12/25/2015  CLINICAL DATA:  20 year old male with fever and sore throat for 3 days. Difficulty swallowing food and drink. Throat swelling and pain. Initial encounter. EXAM: CT NECK WITH CONTRAST TECHNIQUE: Multidetector CT imaging of the neck was performed using the standard protocol following the bolus administration of intravenous contrast. CONTRAST:  75mL ISOVUE-300 IOPAMIDOL (ISOVUE-300) INJECTION 61% COMPARISON:  None. FINDINGS: Pharynx and larynx: Edema and asymmetric soft tissue swelling from the left glossal tonsillar sulcus to the left piriform sinus, this appears secondary to the severe abnormality of the left sublingual space and submandibular gland (see the neck section below). The larynx otherwise appears normal. No significant swelling of the epiglottis. There is superimposed palatine tonsil enlargement and hyper enhancement. No tonsillar abscess.  The nasopharynx has a more normal appearance for patient age. The superior parapharyngeal spaces are normal. The retropharyngeal space is normal. Salivary glands: The sublingual space and especially the left submandibular space are abnormal with severe soft tissue swelling and edema. The etiology appears to be a large 11 x 10 mm obstructing sialolith of the left submandibular duct located near the junction of the left submandibular and sublingual spaces (series 2, image 47). There is a 2 cm oval fluid collection and/or severe enlargement of the duct just posterior to the calculus. Ductal ectasia is seen to extend into the hilum of the left submandibular gland which appears mildly enlarged and hyper enhancing. There also appears to be some hyper enhancement and asymmetric enlargement of the left sublingual gland. There is mild rightward midline shift at the level of the sublingual space. No definite abnormality of the right submandibular gland. Both parotid glands appear normal. No other sialolithiasis identified. Thyroid: Negative. Lymph nodes: Mild hyper enhancing  and asymmetrically enlarged left level 2, level 3, and level 5 lymph nodes. More bland appearance of the level 1 nodes. No right side lymphadenopathy. No cystic or necrotic nodes. Vascular: Major vascular structures in the neck and at the skullbase are patent including the left internal jugular vein. The left vertebral artery is dominant. Limited intracranial: Negative. Visualized orbits: Negative. Mastoids and visualized paranasal sinuses: Hyperplastic paranasal sinuses. Bilateral ethmoid, maxillary, and left sphenoid sinus mucosal thickening. Bilateral hyperplastic petrous apex and left sphenoid wing air cells. The tympanic cavities and mastoids are clear. Skeleton: There are unerupted and possibly supernumerary bilateral mandibular teeth at the level of the bicuspid (series 4, image 65). No acute dental process identified. Mild scoliosis. No acute  osseous abnormality identified. Upper chest: Negative lung apices. Negative visualized superior mediastinum. IMPRESSION: 1. Severe obstruction of the left submandibular gland by a large 10 x 11 mm sialolith with severe secondary inflammation of the sublingual and left submandibular spaces. Recommend ENT Consultation. 2. A 2 cm fluid collection posterior to the sialolith at least in part represents the obstructed left submandibular duct. But abscess or superinfection here is difficult to exclude. 3. Secondary edema and soft tissue swelling along the lateral wall of the left hypopharynx. Suggestion also of bilateral palatine tonsil hyper enhancement raising the possibility of superimposed tonsillitis. 4. No other fluid collection in the neck. There is reactive left side lymphadenopathy. 5. Incidental hyperplastic paranasal sinuses with mild mucosal thickening. 6. 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 Electronically Signed   By: Odessa FlemingH  Hall M.D.   On: 12/25/2015 20:26    MDM  I consulted Dr.Teoh recommends inpatient treatment with intravenous antibiotics. Patient can eat. Final diagnoses:  None  Dr. Reyne Dumaseohwill see him in the hospital tomorrow. He recommends hospitalist service to admit patient.Dr Julian ReilGardner was consulted By me & outpatient emergency department and arranged for admission to stepdown unit. Plan intravenous fluids, intravenous antibiotics. Patient persistent and significant tachycardia after treatment with intravenous fluids. Concern for sepsis Diagnosis #1 salivary duct stone with intraoral infection #2 fever #3 tachycardia  CRITICAL CARE Performed by: Doug SouJACUBOWITZ,Odyn Turko Total critical care time: 30 minutes Critical care time was exclusive of separately billable procedures and treating other patients. Critical care was necessary to treat or prevent imminent or life-threatening deterioration. Critical care was time spent personally by me on the following  activities: development of treatment plan with patient and/or surrogate as well as nursing, discussions with consultants, evaluation of patient's response to treatment, examination of patient, obtaining history from patient or surrogate, ordering and performing treatments and interventions, ordering and review of laboratory studies, ordering and review of radiographic studies, pulse oximetry and re-evaluation of patient's condition.   Doug SouSam Mathius Birkeland, MD 12/26/15 Ivor Reining0003  Doug SouSam Jamarrius Salay, MD 12/26/15 0005

## 2015-12-26 LAB — BASIC METABOLIC PANEL
Anion gap: 11 (ref 5–15)
BUN: 10 mg/dL (ref 6–20)
CALCIUM: 8.6 mg/dL — AB (ref 8.9–10.3)
CO2: 22 mmol/L (ref 22–32)
CREATININE: 1.06 mg/dL (ref 0.61–1.24)
Chloride: 103 mmol/L (ref 101–111)
GFR calc Af Amer: >60 mL/min (ref >60)
GLUCOSE: 89 mg/dL (ref 65–99)
POTASSIUM: 5.1 mmol/L (ref 3.5–5.1)
Sodium: 136 mmol/L (ref 135–145)

## 2015-12-26 LAB — LACTIC ACID, PLASMA
LACTIC ACID, VENOUS: 1.2 mmol/L (ref 0.5–2.0)
Lactic Acid, Venous: 1.9 mmol/L (ref 0.5–2.0)

## 2015-12-26 LAB — RAPID URINE DRUG SCREEN, HOSP PERFORMED
AMPHETAMINES: NOT DETECTED
Barbiturates: NOT DETECTED
Benzodiazepines: NOT DETECTED
Cocaine: NOT DETECTED
OPIATES: NOT DETECTED
Tetrahydrocannabinol: NOT DETECTED

## 2015-12-26 LAB — CBC
HCT: 47.9 % (ref 39.0–52.0)
Hemoglobin: 16 g/dL (ref 13.0–17.0)
MCH: 27.2 pg (ref 26.0–34.0)
MCHC: 33.4 g/dL (ref 30.0–36.0)
MCV: 81.3 fL (ref 78.0–100.0)
PLATELETS: 199 10*3/uL (ref 150–400)
RBC: 5.89 MIL/uL — ABNORMAL HIGH (ref 4.22–5.81)
RDW: 15.2 % (ref 11.5–15.5)
WBC: 9.2 10*3/uL (ref 4.0–10.5)

## 2015-12-26 LAB — MRSA PCR SCREENING: MRSA by PCR: NEGATIVE

## 2015-12-26 LAB — T4, FREE: FREE T4: 1.09 ng/dL (ref 0.61–1.12)

## 2015-12-26 LAB — TSH: TSH: 1.629 u[IU]/mL (ref 0.350–4.500)

## 2015-12-26 MED ORDER — HYDROMORPHONE HCL 1 MG/ML IJ SOLN
0.5000 mg | INTRAMUSCULAR | Status: DC | PRN
  Administered 2015-12-26: 1 mg via INTRAVENOUS
  Filled 2015-12-26: qty 1

## 2015-12-26 MED ORDER — METOPROLOL TARTRATE 5 MG/5ML IV SOLN
5.0000 mg | Freq: Once | INTRAVENOUS | Status: AC
  Administered 2015-12-26: 5 mg via INTRAVENOUS
  Filled 2015-12-26: qty 5

## 2015-12-26 MED ORDER — METOPROLOL TARTRATE 12.5 MG HALF TABLET
12.5000 mg | ORAL_TABLET | Freq: Two times a day (BID) | ORAL | Status: DC
  Administered 2015-12-26 (×2): 12.5 mg via ORAL
  Filled 2015-12-26 (×2): qty 1

## 2015-12-26 NOTE — Care Management Note (Signed)
Case Management Note  Patient Details  Name: Guy Sutton MRN: [id number] Date of Birth: [birth year]  Subjective/Objective:             Infection of the salivary glands and neck swelling       Action/Plan: Date:  December 26, 2015 Chart reviewed for concurrent status and case management needs. Will continue to follow the patient for changes and needs: Expected discharge date: [id number] Marcelle SmilingRhonda Tyler Cubit, BSN, [place]RN3, [place]   [phone] Expected Discharge Date:  12/28/15               Expected Discharge Plan:  Home/Self Care  In-House Referral:  NA  Discharge planning Services  CM Consult  Post Acute Care Choice:  NA Choice offered to:  NA  DME Arranged:  N/A DME Agency:  NA  HH Arranged:  NA HH Agency:  NA  Status of Service:  In process, will continue to follow  Medicare Important Message Given:    Date Medicare IM Given:    Medicare IM give by:    Date Additional Medicare IM Given:    Additional Medicare Important Message give by:     If discussed at Long Length of Stay Meetings, dates discussed:    Additional Comments:  Golda AcreDavis, Derrall Hicks Lynn, RN 12/26/2015, 10:08 AM

## 2015-12-26 NOTE — Progress Notes (Signed)
PROGRESS NOTE    MANN SKAGGS  ZOX:[id number] DOB: [birth year] DOA: 12/25/2015 PCP: No primary care provider on file. Brief Narrative: Guy Sutton is a 20 y.o. male with medical history significant of previously health. Patient presents to the ED with c/o 2-3 day history of sore throat, pain with swallowing, associated with fever.ED Course: Patient found to have submandibular duct stone with obstruction and fluid collection behind stone. Swelling in this area, not really a lot of airway effacement on the CT scan. Dr. Suszanne Conners was called by EDP  Assessment & Plan: Left submandibular sialolithiasis and sialoadenitis -improving on IV Clindamycin -ENT consulted per Dr.Teoh appreciated, recommended that " patient will likely need to undergo excision of his left submandibular gland after the acute inflammation has resolved and to FU in 1 week. -unfortunately choked after some clears -will restart clears after few hours and monitor -continue IVF  Sinus tachycardia -due to #1, if doesn't start o turn around soon will use low dose BB -check TSh/T4 and UDS  DVT proph: lovenox  Consultants:  ENT Dr.Teoh    Antimicrobials: Clindamycin 6/4   Subjective: Swelling starting to improve  Objective: Filed Vitals:   12/26/15 0600 12/26/15 0700 12/26/15 0754 12/26/15 0800  BP: 145/101 125/90  143/97  Pulse: 134 124  131  Temp:   98.7 F (37.1 C)   TempSrc:   Oral   Resp: 16 13  17   Height:      Weight:      SpO2: 98% 99%  100%    Intake/Output Summary (Last 24 hours) at 12/26/15 1154 Last data filed at 12/26/15 1100  Gross per 24 hour  Intake 5157.5 ml  Output    950 ml  Net 4207.5 ml   Filed Weights   12/26/15 0000  Weight: 68.5 kg (151 lb 0.2 oz)    Examination:  General exam: Appears calm and comfortable  HEENT: L salivary gland swelling, pooling of saliva noted Respiratory system: Clear to auscultation. Respiratory effort normal. Cardiovascular system: S1 & S2 heard,  RRR. No JVD, murmurs, rubs, gallops or clicks. No pedal edema. Gastrointestinal system: Abdomen is nondistended, soft and nontender. No organomegaly or masses felt. Normal bowel sounds  Central nervous system: Alert and oriented. No focal neurological deficits. Extremities: Symmetric 5 x 5 power. Skin: No rashes, lesions or ulcers Psychiatry: Judgement and insight appear normal. Mood & affect appropriate.     Data Reviewed: I have personally reviewed following labs and imaging studies  CBC:  Recent Labs Lab 12/25/15 1805 12/25/15 1816 12/26/15 0341  WBC 9.8  --  9.2  NEUTROABS 7.0  --   --   HGB 16.9 18.7* 16.0  HCT 48.6 55.0* 47.9  MCV 80.3  --  81.3  PLT 215  --  199   Basic Metabolic Panel:  Recent Labs Lab 12/25/15 1816 12/26/15 0341  NA 138 136  K 4.0 5.1  CL 100* 103  CO2  --  22  GLUCOSE 100* 89  BUN 11 10  CREATININE 1.20 1.06  CALCIUM  --  8.6*   GFR: Estimated Creatinine Clearance: 105.9 mL/min (by C-G formula based on Cr of 1.06). Liver Function Tests: No results for input(s): AST, ALT, ALKPHOS, BILITOT, PROT, ALBUMIN in the last 168 hours. No results for input(s): LIPASE, AMYLASE in the last 168 hours. No results for input(s): AMMONIA in the last 168 hours. Coagulation Profile: No results for input(s): INR, PROTIME in the last 168 hours. Cardiac Enzymes: No results for  input(s): CKTOTAL, CKMB, CKMBINDEX, TROPONINI in the last 168 hours. BNP (last 3 results) No results for input(s): PROBNP in the last 8760 hours. HbA1C: No results for input(s): HGBA1C in the last 72 hours. CBG: No results for input(s): GLUCAP in the last 168 hours. Lipid Profile: No results for input(s): CHOL, HDL, LDLCALC, TRIG, CHOLHDL, LDLDIRECT in the last 72 hours. Thyroid Function Tests: No results for input(s): TSH, T4TOTAL, FREET4, T3FREE, THYROIDAB in the last 72 hours. Anemia Panel: No results for input(s): VITAMINB12, FOLATE, FERRITIN, TIBC, IRON, RETICCTPCT in the  last 72 hours. Urine analysis:    Component Value Date/Time   COLORURINE YELLOW 11/14/2013 1933   APPEARANCEUR CLEAR 11/14/2013 1933   LABSPEC 1.036* 11/14/2013 1933   PHURINE 6.0 11/14/2013 1933   GLUCOSEU NEGATIVE 11/14/2013 1933   HGBUR NEGATIVE 11/14/2013 1933   BILIRUBINUR SMALL* 11/14/2013 1933   KETONESUR 15* 11/14/2013 1933   PROTEINUR NEGATIVE 11/14/2013 1933   UROBILINOGEN 1.0 11/14/2013 1933   NITRITE NEGATIVE 11/14/2013 1933   LEUKOCYTESUR NEGATIVE 11/14/2013 1933   Sepsis Labs: (procalcitonin:4,lacticidven:4)  ) Recent Results (from the past 240 hour(s))  Rapid strep screen (not at [facility])     Status: None   Collection Time: 12/25/15  6:10 PM  Result Value Ref Range Status   Streptococcus, Group A Screen (Direct) NEGATIVE NEGATIVE Corrected    Comment: (NOTE) A Rapid Antigen test may result negative if the antigen level in the sample is below the detection level of this test. The FDA has not cleared this test as a stand-alone test therefore the rapid antigen negative result has reflexed to a Group A Strep culture. CORRECTED ON 06/04 AT 2104: PREVIOUSLY REPORTED AS NEGATIVE   MRSA PCR Screening     Status: None   Collection Time: 12/25/15 11:35 PM  Result Value Ref Range Status   MRSA by PCR NEGATIVE NEGATIVE Final    Comment:        The GeneXpert MRSA Assay (FDA approved for NASAL specimens only), is one component of a comprehensive MRSA colonization surveillance program. It is not intended to diagnose MRSA infection nor to guide or monitor treatment for MRSA infections.          Radiology Studies: Ct Soft Tissue Neck W Contrast  12/25/2015  CLINICAL DATA:  20 year old male with fever and sore throat for 3 days. Difficulty swallowing food and drink. Throat swelling and pain. Initial encounter. EXAM: CT NECK WITH CONTRAST TECHNIQUE: Multidetector CT imaging of the neck was performed using the standard protocol following the bolus  administration of intravenous contrast. CONTRAST:  75mL ISOVUE-300 IOPAMIDOL (ISOVUE-300) INJECTION 61% COMPARISON:  None. FINDINGS: Pharynx and larynx: Edema and asymmetric soft tissue swelling from the left glossal tonsillar sulcus to the left piriform sinus, this appears secondary to the severe abnormality of the left sublingual space and submandibular gland (see the neck section below). The larynx otherwise appears normal. No significant swelling of the epiglottis. There is superimposed palatine tonsil enlargement and hyper enhancement. No tonsillar abscess. The nasopharynx has a more normal appearance for patient age. The superior parapharyngeal spaces are normal. The retropharyngeal space is normal. Salivary glands: The sublingual space and especially the left submandibular space are abnormal with severe soft tissue swelling and edema. The etiology appears to be a large 11 x 10 mm obstructing sialolith of the left submandibular duct located near the junction of the left submandibular and sublingual spaces (series 2, image 47). There is a 2 cm oval fluid collection and/or severe enlargement  of the duct just posterior to the calculus. Ductal ectasia is seen to extend into the hilum of the left submandibular gland which appears mildly enlarged and hyper enhancing. There also appears to be some hyper enhancement and asymmetric enlargement of the left sublingual gland. There is mild rightward midline shift at the level of the sublingual space. No definite abnormality of the right submandibular gland. Both parotid glands appear normal. No other sialolithiasis identified. Thyroid: Negative. Lymph nodes: Mild hyper enhancing and asymmetrically enlarged left level 2, level 3, and level 5 lymph nodes. More bland appearance of the level 1 nodes. No right side lymphadenopathy. No cystic or necrotic nodes. Vascular: Major vascular structures in the neck and at the skullbase are patent including the left internal jugular  vein. The left vertebral artery is dominant. Limited intracranial: Negative. Visualized orbits: Negative. Mastoids and visualized paranasal sinuses: Hyperplastic paranasal sinuses. Bilateral ethmoid, maxillary, and left sphenoid sinus mucosal thickening. Bilateral hyperplastic petrous apex and left sphenoid wing air cells. The tympanic cavities and mastoids are clear. Skeleton: There are unerupted and possibly supernumerary bilateral mandibular teeth at the level of the bicuspid (series 4, image 65). No acute dental process identified. Mild scoliosis. No acute osseous abnormality identified. Upper chest: Negative lung apices. Negative visualized superior mediastinum. IMPRESSION: 1. Severe obstruction of the left submandibular gland by a large 10 x 11 mm sialolith with severe secondary inflammation of the sublingual and left submandibular spaces. Recommend ENT Consultation. 2. A 2 cm fluid collection posterior to the sialolith at least in part represents the obstructed left submandibular duct. But abscess or superinfection here is difficult to exclude. 3. Secondary edema and soft tissue swelling along the lateral wall of the left hypopharynx. Suggestion also of bilateral palatine tonsil hyper enhancement raising the possibility of superimposed tonsillitis. 4. No other fluid collection in the neck. There is reactive left side lymphadenopathy. 5. Incidental hyperplastic paranasal sinuses with mild mucosal thickening. 6. 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 Electronically Signed   By: Odessa FlemingH  Hall M.D.   On: 12/25/2015 20:26        Scheduled Meds: . clindamycin (CLEOCIN) IV  600 mg Intravenous Q6H  . heparin  5,000 Units Subcutaneous Q8H  . sodium chloride  1,000 mL Intravenous Once   Continuous Infusions: . sodium chloride 125 mL/hr at 12/26/15 0620     LOS: 1 day    Time spent: 35min    Zannie CovePreetha Jynesis Nakamura, MD [facility] Pager [phone]  If  7PM-7AM, please contact night-coverage [email or web address] Password [email or web address] 12/26/2015, 11:54 AM

## 2015-12-26 NOTE — Consult Note (Signed)
Reason for Consult: Submandibular sialolithiasis and sialoadenitis Referring Physician: Orlie Dakin, MD  HPI:  Guy Sutton is an 20 y.o. male who presented to the [place] emergency room last night complaining of left-sided sore throat for the past 2 days. Pain is worse with swallowing. No shortness of breath, no nausea or vomiting, no other associated symptoms. His CT scan showed a large left submandibular stone, with saliva accumulation behind the stone. His submandibular gland was also inflamed.  History reviewed. No pertinent past medical history.  History reviewed. No pertinent past surgical history.  History reviewed. No pertinent family history.  Social History:  reports that he has never smoked. He has never used smokeless tobacco. He reports that he does not drink alcohol or use illicit drugs.  Allergies: No Known Allergies  Prior to Admission medications   Not on File    Medications:  I have reviewed the patient's current medications. Scheduled: . clindamycin (CLEOCIN) IV  600 mg Intravenous Q6H  . heparin  5,000 Units Subcutaneous Q8H  . sodium chloride  1,000 mL Intravenous Once   IZT:[id number], fentaNYL (SUBLIMAZE) injection  Results for orders placed or performed during the hospital encounter of 12/25/15 (from the past 48 hour(s))  CBC with Differential/Platelet     Status: Abnormal   Collection Time: 12/25/15  6:05 PM  Result Value Ref Range   WBC 9.8 4.0 - 10.5 K/uL   RBC 6.05 (H) 4.22 - 5.81 MIL/uL   Hemoglobin 16.9 13.0 - 17.0 g/dL   HCT 48.6 39.0 - 52.0 %   MCV 80.3 78.0 - 100.0 fL   MCH 27.9 26.0 - 34.0 pg   MCHC 34.8 30.0 - 36.0 g/dL   RDW 15.1 11.5 - 15.5 %   Platelets 215 150 - 400 K/uL   Neutrophils Relative % 72 %   Neutro Abs 7.0 1.7 - 7.7 K/uL   Lymphocytes Relative 14 %   Lymphs Abs 1.4 0.7 - 4.0 K/uL   Monocytes Relative 14 %   Monocytes Absolute 1.4 (H) 0.1 - 1.0 K/uL   Eosinophils Relative 0 %   Eosinophils Absolute 0.0 0.0 -  0.7 K/uL   Basophils Relative 0 %   Basophils Absolute 0.0 0.0 - 0.1 K/uL  Rapid strep screen (not at [facility])     Status: None   Collection Time: 12/25/15  6:10 PM  Result Value Ref Range   Streptococcus, Group A Screen (Direct) NEGATIVE NEGATIVE    Comment: (NOTE) A Rapid Antigen test may result negative if the antigen level in the sample is below the detection level of this test. The FDA has not cleared this test as a stand-alone test therefore the rapid antigen negative result has reflexed to a Group A Strep culture. CORRECTED ON 06/04 AT 2104: PREVIOUSLY REPORTED AS NEGATIVE   I-stat chem 8, ed     Status: Abnormal   Collection Time: 12/25/15  6:16 PM  Result Value Ref Range   Sodium 138 135 - 145 mmol/L   Potassium 4.0 3.5 - 5.1 mmol/L   Chloride 100 (L) 101 - 111 mmol/L   BUN 11 6 - 20 mg/dL   Creatinine, Ser 1.20 0.61 - 1.24 mg/dL   Glucose, Bld 100 (H) 65 - 99 mg/dL   Calcium, Ion 1.06 (L) 1.12 - 1.23 mmol/L   TCO2 23 0 - 100 mmol/L   Hemoglobin 18.7 (H) 13.0 - 17.0 g/dL   HCT 55.0 (H) 39.0 - 52.0 %  MRSA PCR Screening  Status: None   Collection Time: 12/25/15 11:35 PM  Result Value Ref Range   MRSA by PCR NEGATIVE NEGATIVE    Comment:        The GeneXpert MRSA Assay (FDA approved for NASAL specimens only), is one component of a comprehensive MRSA colonization surveillance program. It is not intended to diagnose MRSA infection nor to guide or monitor treatment for MRSA infections.   Lactic acid, plasma     Status: None   Collection Time: 12/26/15 12:41 AM  Result Value Ref Range   Lactic Acid, Venous 1.9 0.5 - 2.0 mmol/L  CBC     Status: Abnormal   Collection Time: 12/26/15  3:41 AM  Result Value Ref Range   WBC 9.2 4.0 - 10.5 K/uL   RBC 5.89 (H) 4.22 - 5.81 MIL/uL   Hemoglobin 16.0 13.0 - 17.0 g/dL   HCT 47.9 39.0 - 52.0 %   MCV 81.3 78.0 - 100.0 fL   MCH 27.2 26.0 - 34.0 pg   MCHC 33.4 30.0 - 36.0 g/dL   RDW 15.2 11.5 - 15.5 %   Platelets 199 150 -  400 K/uL  Basic metabolic panel     Status: Abnormal   Collection Time: 12/26/15  3:41 AM  Result Value Ref Range   Sodium 136 135 - 145 mmol/L   Potassium 5.1 3.5 - 5.1 mmol/L    Comment: DELTA CHECK NOTED SLIGHT HEMOLYSIS    Chloride 103 101 - 111 mmol/L   CO2 22 22 - 32 mmol/L   Glucose, Bld 89 65 - 99 mg/dL   BUN 10 6 - 20 mg/dL   Creatinine, Ser 1.06 0.61 - 1.24 mg/dL   Calcium 8.6 (L) 8.9 - 10.3 mg/dL   GFR calc non Af Amer >60 >60 mL/min   GFR calc Af Amer >60 >60 mL/min    Comment: (NOTE) The eGFR has been calculated using the CKD EPI equation. This calculation has not been validated in all clinical situations. eGFR's persistently <60 mL/min signify possible Chronic Kidney Disease.    Anion gap 11 5 - 15  Lactic acid, plasma     Status: None   Collection Time: 12/26/15  3:41 AM  Result Value Ref Range   Lactic Acid, Venous 1.2 0.5 - 2.0 mmol/L    Ct Soft Tissue Neck W Contrast  12/25/2015  CLINICAL DATA:  20 year old male with fever and sore throat for 3 days. Difficulty swallowing food and drink. Throat swelling and pain. Initial encounter. EXAM: CT NECK WITH CONTRAST TECHNIQUE: Multidetector CT imaging of the neck was performed using the standard protocol following the bolus administration of intravenous contrast. CONTRAST:  33m ISOVUE-300 IOPAMIDOL (ISOVUE-300) INJECTION 61% COMPARISON:  None. FINDINGS: Pharynx and larynx: Edema and asymmetric soft tissue swelling from the left glossal tonsillar sulcus to the left piriform sinus, this appears secondary to the severe abnormality of the left sublingual space and submandibular gland (see the neck section below). The larynx otherwise appears normal. No significant swelling of the epiglottis. There is superimposed palatine tonsil enlargement and hyper enhancement. No tonsillar abscess. The nasopharynx has a more normal appearance for patient age. The superior parapharyngeal spaces are normal. The retropharyngeal space is normal.  Salivary glands: The sublingual space and especially the left submandibular space are abnormal with severe soft tissue swelling and edema. The etiology appears to be a large 11 x 10 mm obstructing sialolith of the left submandibular duct located near the junction of the left submandibular and sublingual spaces (series  2, image 47). There is a 2 cm oval fluid collection and/or severe enlargement of the duct just posterior to the calculus. Ductal ectasia is seen to extend into the hilum of the left submandibular gland which appears mildly enlarged and hyper enhancing. There also appears to be some hyper enhancement and asymmetric enlargement of the left sublingual gland. There is mild rightward midline shift at the level of the sublingual space. No definite abnormality of the right submandibular gland. Both parotid glands appear normal. No other sialolithiasis identified. Thyroid: Negative. Lymph nodes: Mild hyper enhancing and asymmetrically enlarged left level 2, level 3, and level 5 lymph nodes. More bland appearance of the level 1 nodes. No right side lymphadenopathy. No cystic or necrotic nodes. Vascular: Major vascular structures in the neck and at the skullbase are patent including the left internal jugular vein. The left vertebral artery is dominant. Limited intracranial: Negative. Visualized orbits: Negative. Mastoids and visualized paranasal sinuses: Hyperplastic paranasal sinuses. Bilateral ethmoid, maxillary, and left sphenoid sinus mucosal thickening. Bilateral hyperplastic petrous apex and left sphenoid wing air cells. The tympanic cavities and mastoids are clear. Skeleton: There are unerupted and possibly supernumerary bilateral mandibular teeth at the level of the bicuspid (series 4, image 65). No acute dental process identified. Mild scoliosis. No acute osseous abnormality identified. Upper chest: Negative lung apices. Negative visualized superior mediastinum. IMPRESSION: 1. Severe obstruction of the  left submandibular gland by a large 10 x 11 mm sialolith with severe secondary inflammation of the sublingual and left submandibular spaces. Recommend ENT Consultation. 2. A 2 cm fluid collection posterior to the sialolith at least in part represents the obstructed left submandibular duct. But abscess or superinfection here is difficult to exclude. 3. Secondary edema and soft tissue swelling along the lateral wall of the left hypopharynx. Suggestion also of bilateral palatine tonsil hyper enhancement raising the possibility of superimposed tonsillitis. 4. No other fluid collection in the neck. There is reactive left side lymphadenopathy. 5. Incidental hyperplastic paranasal sinuses with mild mucosal thickening. 6. 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 Electronically Signed   By: Genevie Ann M.D.   On: 12/25/2015 20:26   Review of Systems  Constitutional: Positive for fever.  HENT: Positive for sore throat.  Respiratory: Negative.  Cardiovascular: Negative.  Gastrointestinal: Negative.  Musculoskeletal: Negative.  Skin: Negative.  Neurological: Negative.  Psychiatric/Behavioral: Negative.  All other systems reviewed and are negative.  Blood pressure 125/90, pulse 124, temperature 99.2 F (37.3 C), temperature source Oral, resp. rate 13, height '6\' 2"'$  (1.88 m), weight 68.5 kg (151 lb 0.2 oz), SpO2 99 %. Physical Exam  Constitutional: He appears well-developed and well-nourished. No distress.  Head: Normocephalic and atraumatic.  Right Ear: External ear normal.  Left Ear: External ear normal.  Oropharynx reddened. Uvula midline tonsils symmetric bilaterally, not touching. No exudate  Eyes: Conjunctivae are normal. Pupils are equal, round, and reactive to light.  Neck: Neck supple. No tracheal deviation present. No thyromegaly present.  The left sub-mandibular gland is enlarged and tender to palpation. Cardiovascular: Normal rate.   Pulmonary/Chest: Effort normal and breath sounds normal.  Neurological: He is alert. Coordination normal.  Skin: Skin is warm and dry. No rash noted.  Psychiatric: He has a normal mood and affect.  Nursing note and vitals reviewed.  Assessment/Plan: Left submandibular sialolithiasis and sialoadenitis. The patient will likely need to undergo excision of his left submandibular gland after the acute inflammation has resolved. The patient may be discharged home with oral clindamycin (300 mg  by mouth 4 times a day for 10 days). The patient may follow-up with me as an outpatient in approximately 1 week. The patient should be instructed to obtain ample hydration, and apply warm compresses to the left submandibular area at least QID daily.   Amory Zbikowski,SUI W 12/26/2015, 7:21 AM

## 2015-12-27 LAB — BASIC METABOLIC PANEL
ANION GAP: 10 (ref 5–15)
BUN: 8 mg/dL (ref 6–20)
CALCIUM: 8.7 mg/dL — AB (ref 8.9–10.3)
CO2: 24 mmol/L (ref 22–32)
Chloride: 102 mmol/L (ref 101–111)
Creatinine, Ser: 0.98 mg/dL (ref 0.61–1.24)
GFR calc Af Amer: >60 mL/min (ref >60)
GLUCOSE: 86 mg/dL (ref 65–99)
Potassium: 4.2 mmol/L (ref 3.5–5.1)
Sodium: 136 mmol/L (ref 135–145)

## 2015-12-27 LAB — CBC
HCT: 44.2 % (ref 39.0–52.0)
Hemoglobin: 14.5 g/dL (ref 13.0–17.0)
MCH: 27.1 pg (ref 26.0–34.0)
MCHC: 32.8 g/dL (ref 30.0–36.0)
MCV: 82.6 fL (ref 78.0–100.0)
PLATELETS: 193 10*3/uL (ref 150–400)
RBC: 5.35 MIL/uL (ref 4.22–5.81)
RDW: 15.1 % (ref 11.5–15.5)
WBC: 8.6 10*3/uL (ref 4.0–10.5)

## 2015-12-27 NOTE — Progress Notes (Signed)
PROGRESS NOTE    HALO LASKI  AOZ:[id number] DOB: January 31, 1996 DOA: 12/25/2015 PCP: No primary care provider on file. Brief Narrative: Guy Sutton is a 20 y.o. male with medical history significant of previously health. Patient presents to the ED with c/o 2-3 day history of sore throat, pain with swallowing, associated with fever.ED Course: Patient found to have submandibular duct stone with obstruction and fluid collection behind stone. Swelling in this area, not really a lot of airway effacement on the CT scan. Dr. Suszanne Conners was called by EDP. Admitted, improving on Iv CLinda Still a lot of saliva/secretions requiring frequent self suctioning  Assessment & Plan: Left submandibular sialolithiasis and sialoadenitis -improving on IV Clindamycin, swelling much improved -ENT consulted per Dr.Teoh appreciated, recommended that " patient will likely need to undergo excision of his left submandibular gland after the acute inflammation has resolved and to FU in 1 week. -liquid diet, unable to eat much at all, large amount of saliva/pooling requiring freq suctioning -Called and d/w Dr.teoh again, recommended possibly another 24h of IV Abx in hospital, add warm compresses, he will FU in am -continue IVF  Sinus tachycardia -due to #1, resolved now -TSh/T4: WNL  DVT proph: lovenox  Consultants:  ENT Dr.Teoh    Antimicrobials: Clindamycin 6/4   Subjective: Swelling starting to improve, lot of saliva  Objective: Filed Vitals:   12/27/15 0800 12/27/15 1000 12/27/15 1200 12/27/15 1307  BP: 133/77 142/80  141/78  Pulse: 82 74  69  Temp: 99.4 F (37.4 C)  99 F (37.2 C) 100.4 F (38 C)  TempSrc: Oral  Oral Oral  Resp: 19 12  16   Height:    6\' 2"  (1.88 m)  Weight:      SpO2: 99% 100%  100%    Intake/Output Summary (Last 24 hours) at 12/27/15 1453 Last data filed at 12/27/15 1309  Gross per 24 hour  Intake   3525 ml  Output   1351 ml  Net   2174 ml   Filed Weights   12/26/15  0000  Weight: 68.5 kg (151 lb 0.2 oz)    Examination:  General exam: Appears calm and comfortable, thinly built, no distress HEENT: L salivary gland swelling, pooling of saliva noted-improving from yesterday Respiratory system: Clear to auscultation. Respiratory effort normal. Cardiovascular system: S1 & S2 heard, RRR. No JVD, murmurs, rubs, gallops or clicks. No pedal edema. Gastrointestinal system: Abdomen is nondistended, soft and nontender. No organomegaly or masses felt. Normal bowel sounds  Central nervous system: Alert and oriented. No focal neurological deficits. Extremities: Symmetric 5 x 5 power. Skin: No rashes, lesions or ulcers Psychiatry: Judgement and insight appear normal. Mood & affect appropriate.     Data Reviewed: I have personally reviewed following labs and imaging studies  CBC:  Recent Labs Lab 12/25/15 1805 12/25/15 1816 12/26/15 0341 12/27/15 0333  WBC 9.8  --  9.2 8.6  NEUTROABS 7.0  --   --   --   HGB 16.9 18.7* 16.0 14.5  HCT 48.6 55.0* 47.9 44.2  MCV 80.3  --  81.3 82.6  PLT 215  --  199 193   Basic Metabolic Panel:  Recent Labs Lab 12/25/15 1816 12/26/15 0341 12/27/15 0333  NA 138 136 136  K 4.0 5.1 4.2  CL 100* 103 102  CO2  --  22 24  GLUCOSE 100* 89 86  BUN 11 10 8   CREATININE 1.20 1.06 0.98  CALCIUM  --  8.6* 8.7*   GFR: Estimated  Creatinine Clearance: 114.6 mL/min (by C-G formula based on Cr of 0.98). Liver Function Tests: No results for input(s): AST, ALT, ALKPHOS, BILITOT, PROT, ALBUMIN in the last 168 hours. No results for input(s): LIPASE, AMYLASE in the last 168 hours. No results for input(s): AMMONIA in the last 168 hours. Coagulation Profile: No results for input(s): INR, PROTIME in the last 168 hours. Cardiac Enzymes: No results for input(s): CKTOTAL, CKMB, CKMBINDEX, TROPONINI in the last 168 hours. BNP (last 3 results) No results for input(s): PROBNP in the last 8760 hours. HbA1C: No results for input(s):  HGBA1C in the last 72 hours. CBG: No results for input(s): GLUCAP in the last 168 hours. Lipid Profile: No results for input(s): CHOL, HDL, LDLCALC, TRIG, CHOLHDL, LDLDIRECT in the last 72 hours. Thyroid Function Tests:  Recent Labs  12/26/15 1238  TSH 1.629  FREET4 1.09   Anemia Panel: No results for input(s): VITAMINB12, FOLATE, FERRITIN, TIBC, IRON, RETICCTPCT in the last 72 hours. Urine analysis:    Component Value Date/Time   COLORURINE YELLOW 11/14/2013 1933   APPEARANCEUR CLEAR 11/14/2013 1933   LABSPEC 1.036* 11/14/2013 1933   PHURINE 6.0 11/14/2013 1933   GLUCOSEU NEGATIVE 11/14/2013 1933   HGBUR NEGATIVE 11/14/2013 1933   BILIRUBINUR SMALL* 11/14/2013 1933   KETONESUR 15* 11/14/2013 1933   PROTEINUR NEGATIVE 11/14/2013 1933   UROBILINOGEN 1.0 11/14/2013 1933   NITRITE NEGATIVE 11/14/2013 1933   LEUKOCYTESUR NEGATIVE 11/14/2013 1933   Sepsis Labs: @LABRCNTIP (procalcitonin:4,lacticidven:4)  ) Recent Results (from the past 240 hour(s))  Culture, group A strep     Status: None (Preliminary result)   Collection Time: 12/25/15  6:05 PM  Result Value Ref Range Status   Specimen Description THROAT  Final   Special Requests NONE  Final   Culture   Final    CULTURE REINCUBATED FOR BETTER GROWTH [facility]    Report Status PENDING  Incomplete  Rapid strep screen (not at [facility])     Status: None   Collection Time: 12/25/15  6:10 PM  Result Value Ref Range Status   Streptococcus, Group A Screen (Direct) NEGATIVE NEGATIVE Corrected    Comment: (NOTE) A Rapid Antigen test may result negative if the antigen level in the sample is below the detection level of this test. The FDA has not cleared this test as a stand-alone test therefore the rapid antigen negative result has reflexed to a Group A Strep culture. CORRECTED ON 06/04 AT 2104: PREVIOUSLY REPORTED AS NEGATIVE   MRSA PCR Screening     Status: None   Collection Time: 12/25/15 11:35 PM   Result Value Ref Range Status   MRSA by PCR NEGATIVE NEGATIVE Final    Comment:        The GeneXpert MRSA Assay (FDA approved for NASAL specimens only), is one component of a comprehensive MRSA colonization surveillance program. It is not intended to diagnose MRSA infection nor to guide or monitor treatment for MRSA infections.          Radiology Studies: Ct Soft Tissue Neck W Contrast  12/25/2015  CLINICAL DATA:  20 year old male with fever and sore throat for 3 days. Difficulty swallowing food and drink. Throat swelling and pain. Initial encounter. EXAM: CT NECK WITH CONTRAST TECHNIQUE: Multidetector CT imaging of the neck was performed using the standard protocol following the bolus administration of intravenous contrast. CONTRAST:  75mL ISOVUE-300 IOPAMIDOL (ISOVUE-300) INJECTION 61% COMPARISON:  None. FINDINGS: Pharynx and larynx: Edema and asymmetric soft tissue swelling from the  left glossal tonsillar sulcus to the left piriform sinus, this appears secondary to the severe abnormality of the left sublingual space and submandibular gland (see the neck section below). The larynx otherwise appears normal. No significant swelling of the epiglottis. There is superimposed palatine tonsil enlargement and hyper enhancement. No tonsillar abscess. The nasopharynx has a more normal appearance for patient age. The superior parapharyngeal spaces are normal. The retropharyngeal space is normal. Salivary glands: The sublingual space and especially the left submandibular space are abnormal with severe soft tissue swelling and edema. The etiology appears to be a large 11 x 10 mm obstructing sialolith of the left submandibular duct located near the junction of the left submandibular and sublingual spaces (series 2, image 47). There is a 2 cm oval fluid collection and/or severe enlargement of the duct just posterior to the calculus. Ductal ectasia is seen to extend into the hilum of the left submandibular  gland which appears mildly enlarged and hyper enhancing. There also appears to be some hyper enhancement and asymmetric enlargement of the left sublingual gland. There is mild rightward midline shift at the level of the sublingual space. No definite abnormality of the right submandibular gland. Both parotid glands appear normal. No other sialolithiasis identified. Thyroid: Negative. Lymph nodes: Mild hyper enhancing and asymmetrically enlarged left level 2, level 3, and level 5 lymph nodes. More bland appearance of the level 1 nodes. No right side lymphadenopathy. No cystic or necrotic nodes. Vascular: Major vascular structures in the neck and at the skullbase are patent including the left internal jugular vein. The left vertebral artery is dominant. Limited intracranial: Negative. Visualized orbits: Negative. Mastoids and visualized paranasal sinuses: Hyperplastic paranasal sinuses. Bilateral ethmoid, maxillary, and left sphenoid sinus mucosal thickening. Bilateral hyperplastic petrous apex and left sphenoid wing air cells. The tympanic cavities and mastoids are clear. Skeleton: There are unerupted and possibly supernumerary bilateral mandibular teeth at the level of the bicuspid (series 4, image 65). No acute dental process identified. Mild scoliosis. No acute osseous abnormality identified. Upper chest: Negative lung apices. Negative visualized superior mediastinum. IMPRESSION: 1. Severe obstruction of the left submandibular gland by a large 10 x 11 mm sialolith with severe secondary inflammation of the sublingual and left submandibular spaces. Recommend ENT Consultation. 2. A 2 cm fluid collection posterior to the sialolith at least in part represents the obstructed left submandibular duct. But abscess or superinfection here is difficult to exclude. 3. Secondary edema and soft tissue swelling along the lateral wall of the left hypopharynx. Suggestion also of bilateral palatine tonsil hyper enhancement raising  the possibility of superimposed tonsillitis. 4. No other fluid collection in the neck. There is reactive left side lymphadenopathy. 5. Incidental hyperplastic paranasal sinuses with mild mucosal thickening. 6. 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 Electronically Signed   By: Odessa Fleming M.D.   On: 12/25/2015 20:26        Scheduled Meds: . clindamycin (CLEOCIN) IV  600 mg Intravenous Q6H  . heparin  5,000 Units Subcutaneous Q8H  . sodium chloride  1,000 mL Intravenous Once   Continuous Infusions: . sodium chloride 50 mL/hr at 12/27/15 1120     LOS: 2 days    Time spent:    Zannie Cove, MD [facility] Pager [phone]  If 7PM-7AM, please contact night-coverage [email or web address] Password [email or web address] 12/27/2015, 2:53 PM

## 2015-12-28 DIAGNOSIS — K1120 Sialoadenitis, unspecified: Secondary | ICD-10-CM

## 2015-12-28 LAB — CULTURE, GROUP A STREP (THRC)

## 2015-12-28 MED ORDER — CLINDAMYCIN HCL 300 MG PO CAPS: 300.0000 mg | ORAL_CAPSULE | Freq: Three times a day (TID) | ORAL | Status: DC

## 2015-12-28 NOTE — Discharge Summary (Signed)
PATIENT DETAILS Name: Guy Sutton Age: 20 y.o. Sex: male Date of Birth: 10-16-1995 MRN: [id number]. Admitting Physician: Hillary Bow, DO PCP:No primary care provider on file.  Admit Date: 12/25/2015 Discharge date: 12/28/2015  Recommendations for Outpatient Follow-up:  1. Ensure follow up with Dr Ina Homes  2. Please repeat CBC/BMET at next visit  PRIMARY DISCHARGE DIAGNOSIS:  Principal Problem:   Submandibular gland infection Active Problems:   Sepsis (HCC)      PAST MEDICAL HISTORY: History reviewed. No pertinent past medical history.  DISCHARGE MEDICATIONS: Current Discharge Medication List    START taking these medications   Details  clindamycin (CLEOCIN) 300 MG capsule Take 1 capsule (300 mg total) by mouth 3 (three) times daily. Qty: 40 capsule, Refills: 0        ALLERGIES:  No Known Allergies  BRIEF HPI:  See H&P, Labs, Consult and Test reports for all details in brief, Guy Sutton is a 20 y.o. male with medical history significant of previously health. Patient presents to the ED with c/o 2-3 day history of sore throat, pain with swallowing, associated with fever.Patient found to have submandibular duct stone with obstruction and fluid collection behind stone  CONSULTATIONS:   ENT  PERTINENT RADIOLOGIC STUDIES: Ct Soft Tissue Neck W Contrast  12/25/2015  CLINICAL DATA:  20 year old male with fever and sore throat for 3 days. Difficulty swallowing food and drink. Throat swelling and pain. Initial encounter. EXAM: CT NECK WITH CONTRAST TECHNIQUE: Multidetector CT imaging of the neck was performed using the standard protocol following the bolus administration of intravenous contrast. CONTRAST:  75mL ISOVUE-300 IOPAMIDOL (ISOVUE-300) INJECTION 61% COMPARISON:  None. FINDINGS: Pharynx and larynx: Edema and asymmetric soft tissue swelling from the left glossal tonsillar sulcus to the left piriform sinus, this appears secondary to the severe abnormality of the  left sublingual space and submandibular gland (see the neck section below). The larynx otherwise appears normal. No significant swelling of the epiglottis. There is superimposed palatine tonsil enlargement and hyper enhancement. No tonsillar abscess. The nasopharynx has a more normal appearance for patient age. The superior parapharyngeal spaces are normal. The retropharyngeal space is normal. Salivary glands: The sublingual space and especially the left submandibular space are abnormal with severe soft tissue swelling and edema. The etiology appears to be a large 11 x 10 mm obstructing sialolith of the left submandibular duct located near the junction of the left submandibular and sublingual spaces (series 2, image 47). There is a 2 cm oval fluid collection and/or severe enlargement of the duct just posterior to the calculus. Ductal ectasia is seen to extend into the hilum of the left submandibular gland which appears mildly enlarged and hyper enhancing. There also appears to be some hyper enhancement and asymmetric enlargement of the left sublingual gland. There is mild rightward midline shift at the level of the sublingual space. No definite abnormality of the right submandibular gland. Both parotid glands appear normal. No other sialolithiasis identified. Thyroid: Negative. Lymph nodes: Mild hyper enhancing and asymmetrically enlarged left level 2, level 3, and level 5 lymph nodes. More bland appearance of the level 1 nodes. No right side lymphadenopathy. No cystic or necrotic nodes. Vascular: Major vascular structures in the neck and at the skullbase are patent including the left internal jugular vein. The left vertebral artery is dominant. Limited intracranial: Negative. Visualized orbits: Negative. Mastoids and visualized paranasal sinuses: Hyperplastic paranasal sinuses. Bilateral ethmoid, maxillary, and left sphenoid sinus mucosal thickening. Bilateral hyperplastic petrous apex and left  sphenoid wing air  cells. The tympanic cavities and mastoids are clear. Skeleton: There are unerupted and possibly supernumerary bilateral mandibular teeth at the level of the bicuspid (series 4, image 65). No acute dental process identified. Mild scoliosis. No acute osseous abnormality identified. Upper chest: Negative lung apices. Negative visualized superior mediastinum. IMPRESSION: 1. Severe obstruction of the left submandibular gland by a large 10 x 11 mm sialolith with severe secondary inflammation of the sublingual and left submandibular spaces. Recommend ENT Consultation. 2. A 2 cm fluid collection posterior to the sialolith at least in part represents the obstructed left submandibular duct. But abscess or superinfection here is difficult to exclude. 3. Secondary edema and soft tissue swelling along the lateral wall of the left hypopharynx. Suggestion also of bilateral palatine tonsil hyper enhancement raising the possibility of superimposed tonsillitis. 4. No other fluid collection in the neck. There is reactive left side lymphadenopathy. 5. Incidental hyperplastic paranasal sinuses with mild mucosal thickening. 6. 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 Electronically Signed   By: Odessa Fleming M.D.   On: 12/25/2015 20:26     PERTINENT LAB RESULTS: CBC:  Recent Labs  12/26/15 0341 12/27/15 0333  WBC 9.2 8.6  HGB 16.0 14.5  HCT 47.9 44.2  PLT 199 193   CMET CMP     Component Value Date/Time   NA 136 12/27/2015 0333   K 4.2 12/27/2015 0333   CL 102 12/27/2015 0333   CO2 24 12/27/2015 0333   GLUCOSE 86 12/27/2015 0333   BUN 8 12/27/2015 0333   CREATININE 0.98 12/27/2015 0333   CALCIUM 8.7* 12/27/2015 0333   PROT 8.0 10/27/2013 1310   ALBUMIN 3.4* 10/27/2013 1310   AST 14 10/27/2013 1310   ALT 10 10/27/2013 1310   ALKPHOS 66 10/27/2013 1310   BILITOT 0.6 10/27/2013 1310   GFRNONAA >60 12/27/2015 0333   GFRAA >60 12/27/2015 0333    GFR Estimated Creatinine  Clearance: 114.6 mL/min (by C-G formula based on Cr of 0.98). No results for input(s): LIPASE, AMYLASE in the last 72 hours. No results for input(s): CKTOTAL, CKMB, CKMBINDEX, TROPONINI in the last 72 hours. Invalid input(s): POCBNP No results for input(s): DDIMER in the last 72 hours. No results for input(s): HGBA1C in the last 72 hours. No results for input(s): CHOL, HDL, LDLCALC, TRIG, CHOLHDL, LDLDIRECT in the last 72 hours.  Recent Labs  12/26/15 1238  TSH 1.629   No results for input(s): VITAMINB12, FOLATE, FERRITIN, TIBC, IRON, RETICCTPCT in the last 72 hours. Coags: No results for input(s): INR in the last 72 hours.  Invalid input(s): PT Microbiology: Recent Results (from the past 240 hour(s))  Culture, group A strep     Status: None (Preliminary result)   Collection Time: 12/25/15  6:05 PM  Result Value Ref Range Status   Specimen Description THROAT  Final   Special Requests NONE  Final   Culture   Final    CULTURE REINCUBATED FOR BETTER GROWTH Performed at [facility]    Report Status PENDING  Incomplete  Rapid strep screen (not at [facility])     Status: None   Collection Time: 12/25/15  6:10 PM  Result Value Ref Range Status   Streptococcus, Group A Screen (Direct) NEGATIVE NEGATIVE Corrected    Comment: (NOTE) A Rapid Antigen test may result negative if the antigen level in the sample is below the detection level of this test. The FDA has not cleared this test as a stand-alone test therefore the  rapid antigen negative result has reflexed to a Group A Strep culture. CORRECTED ON 06/04 AT 2104: PREVIOUSLY REPORTED AS NEGATIVE   MRSA PCR Screening     Status: None   Collection Time: 12/25/15 11:35 PM  Result Value Ref Range Status   MRSA by PCR NEGATIVE NEGATIVE Final    Comment:        The GeneXpert MRSA Assay (FDA approved for NASAL specimens only), is one component of a comprehensive MRSA colonization surveillance program. It is not intended to  diagnose MRSA infection nor to guide or monitor treatment for MRSA infections.      BRIEF HOSPITAL COURSE:  Left submandibular sialolithiasis and sialoadenitis:Significantly improved by day of discharge with IV Clindamycin, and other supportive measures. ENT followed patient throughout the hospital course, recommendations from ENT are to continue Clindamycin for 10 days and to follow up with Dr Suszanne Connerseoh in around 10 days time. He hardly has any submandibular swelling on exam today.   Sinus tachycardia:due to #1, resolved now.TSh/T4: WNL  TODAY-DAY OF DISCHARGE:  Subjective:   Ashad Cogan today has no headache,no chest abdominal pain,no new weakness tingling or numbness, feels much better wants to go home today.   Objective:   Blood pressure 113/75, pulse 76, temperature 98 F (36.7 C), temperature source Oral, resp. rate 16, height 6\' 2"  (1.88 m), weight 68.5 kg (151 lb 0.2 oz), SpO2 100 %.  Intake/Output Summary (Last 24 hours) at 12/28/15 1106 Last data filed at 12/27/15 1309  Gross per 24 hour  Intake     60 ml  Output      1 ml  Net     59 ml   Filed Weights   12/26/15 0000  Weight: 68.5 kg (151 lb 0.2 oz)    Exam Awake Alert, Oriented *3, No new F.N deficits, Normal affect [place].AT,PERRAL Supple Neck,No JVD, No cervical lymphadenopathy appriciated.  Symmetrical Chest wall movement, Good air movement bilaterally, CTAB RRR,No Gallops,Rubs or new Murmurs, No Parasternal Heave +ve B.Sounds, Abd Soft, Non tender, No organomegaly appriciated, No rebound -guarding or rigidity. No Cyanosis, Clubbing or edema, No new Rash or bruise  DISCHARGE CONDITION: Stable  DISPOSITION: Home  DISCHARGE INSTRUCTIONS:    Activity:  As tolerated  Get Medicines reviewed and adjusted: Please take all your medications with you for your next visit with your Primary MD  Please request your Primary MD to go over all hospital tests and procedure/radiological results at the follow up, please  ask your Primary MD to get all Hospital records sent to his/her office.  If you experience worsening of your admission symptoms, develop shortness of breath, life threatening emergency, suicidal or homicidal thoughts you must seek medical attention immediately by calling 911 or calling your MD immediately  if symptoms less severe.  You must read complete instructions/literature along with all the possible adverse reactions/side effects for all the Medicines you take and that have been prescribed to you. Take any new Medicines after you have completely understood and accpet all the possible adverse reactions/side effects.   Do not drive when taking Pain medications.   Do not take more than prescribed Pain, Sleep and Anxiety Medications  Special Instructions: If you have smoked or chewed Tobacco  in the last 2 yrs please stop smoking, stop any regular Alcohol  and or any Recreational drug use.  Wear Seat belts while driving.  Please note  You were cared for by a hospitalist during your hospital stay. Once you are discharged, your primary  care physician will handle any further medical issues. Please note that NO REFILLS for any discharge medications will be authorized once you are discharged, as it is imperative that you return to your primary care physician (or establish a relationship with a primary care physician if you do not have one) for your aftercare needs so that they can reassess your need for medications and monitor your lab values.  Diet recommendation: Regular Diet-soft diet  Discharge Instructions    Call MD for:  persistant nausea and vomiting    Complete by:  As directed      Call MD for:  redness, tenderness, or signs of infection (pain, swelling, redness, odor or green/yellow discharge around incision site)    Complete by:  As directed      Call MD for:  severe uncontrolled pain    Complete by:  As directed      Call MD for:  temperature >100.4    Complete by:  As directed        Diet general    Complete by:  As directed   Soft diet     Increase activity slowly    Complete by:  As directed            Follow-up Information    Follow up with Darletta Moll, MD. Schedule an appointment as soon as possible for a visit in 1 week.   Specialty:  Otolaryngology   Why:  Hospital follow up   Contact information:   [address] [place] [address] [phone]       Total Time spent on discharge equals 25 minutes.  SignedJeoffrey Massed 12/28/2015 11:06 AM

## 2015-12-28 NOTE — Progress Notes (Signed)
Subjective: Feeling much better. Neck pain mostly resolved.  Objective: Vital signs in last 24 hours: Temp:  [98 F (36.7 C)-100.4 F (38 C)] 98 F (36.7 C) (06/07 0607) Pulse Rate:  [69-101] 76 (06/07 0607) Resp:  [12-19] 16 (06/07 0607) BP: (113-142)/(66-80) 113/75 mmHg (06/07 0607) SpO2:  [94 %-100 %] 100 % (06/07 [id number])  Physical Exam  Constitutional: He appears well-developed and well-nourished. No distress.  Head: Normocephalic and atraumatic.  Right Ear: External ear normal.  Left Ear: External ear normal.  Oropharynx reddened. Uvula midline tonsils symmetric bilaterally, not touching. No exudate  Eyes: Conjunctivae are normal. Pupils are equal, round, and reactive to light.  Neck: Neck supple. No tracheal deviation present. No thyromegaly present.  The left sub-mandibular gland swelling has mostly resolved. Pulmonary/Chest: Effort normal and breath sounds normal.  Neurological: He is alert. Coordination normal.  Skin: Skin is warm and dry. No rash noted.  Psychiatric: He has a normal mood and affect.    Recent Labs  12/26/15 0341 12/27/15 0333  WBC 9.2 8.6  HGB 16.0 14.5  HCT 47.9 44.2  PLT 199 193    Recent Labs  12/26/15 0341 12/27/15 0333  NA 136 136  K 5.1 4.2  CL 103 102  CO2 22 24  GLUCOSE 89 86  BUN 10 8  CREATININE 1.06 0.98  CALCIUM 8.6* 8.7*    Medications:  I have reviewed the patient's current medications. Scheduled: . clindamycin (CLEOCIN) IV  600 mg Intravenous Q6H  . heparin  5,000 Units Subcutaneous Q8H  . sodium chloride  1,000 mL Intravenous Once   JXB:JYNWGNFAOZHYQPRN:acetaminophen, HYDROmorphone (DILAUDID) injection  Assessment/Plan: Left submandibular sialolithiasis and sialoadenitis - much improved. The patient will likely need to undergo excision of his left submandibular gland after the acute inflammation has resolved. The patient may be discharged home with oral clindamycin (300 mg by mouth 4 times a day for 10 days). The patient may  follow-up with me as an outpatient in approximately 1 week. The patient should be instructed to obtain ample hydration.   LOS: 3 days   Tehila Sokolow,SUI W 12/28/2015, 6:58 AM

## 2016-01-05 ENCOUNTER — Other Ambulatory Visit (INDEPENDENT_AMBULATORY_CARE_PROVIDER_SITE_OTHER): Payer: Self-pay | Admitting: Otolaryngology

## 2016-01-05 DIAGNOSIS — K115 Sialolithiasis: Secondary | ICD-10-CM

## 2016-01-13 ENCOUNTER — Inpatient Hospital Stay: Admission: RE | Admit: 2016-01-13 | Payer: Medicaid Other | Source: Ambulatory Visit

## 2016-01-20 ENCOUNTER — Ambulatory Visit
Admission: RE | Admit: 2016-01-20 | Discharge: 2016-01-20 | Disposition: A | Discharge status: Home / Self Care | Payer: Medicaid Other | Source: Ambulatory Visit | Attending: Otolaryngology | Admitting: Otolaryngology

## 2016-01-20 DIAGNOSIS — K115 Sialolithiasis: Secondary | ICD-10-CM

## 2016-01-20 MED ORDER — IOPAMIDOL (ISOVUE-300) INJECTION 61%
75.0000 mL | Freq: Once | INTRAVENOUS | Status: AC | PRN
  Administered 2016-01-20: 75 mL via INTRAVENOUS

## 2016-10-22 ENCOUNTER — Encounter (HOSPITAL_COMMUNITY): Payer: Self-pay | Admitting: Emergency Medicine

## 2016-10-22 ENCOUNTER — Emergency Department (HOSPITAL_COMMUNITY)
Admission: EM | Admit: 2016-10-22 | Discharge: 2016-10-23 | Disposition: A | Discharge status: Home / Self Care | Payer: Medicaid Other | Attending: Emergency Medicine | Admitting: Emergency Medicine

## 2016-10-22 DIAGNOSIS — R300 Dysuria: Secondary | ICD-10-CM

## 2016-10-22 DIAGNOSIS — R369 Urethral discharge, unspecified: Secondary | ICD-10-CM | POA: Diagnosis not present

## 2016-10-22 LAB — URINALYSIS, ROUTINE W REFLEX MICROSCOPIC
BACTERIA UA: NONE SEEN
Glucose, UA: NEGATIVE mg/dL
KETONES UR: NEGATIVE mg/dL
NITRITE: NEGATIVE
Protein, ur: 30 mg/dL — AB
SPECIFIC GRAVITY, URINE: 1.035 — AB (ref 1.005–1.030)
pH: 5 (ref 5.0–8.0)

## 2016-10-22 MED ORDER — STERILE WATER FOR INJECTION IJ SOLN
INTRAMUSCULAR | Status: AC
  Administered 2016-10-22: 10 mL
  Filled 2016-10-22: qty 10

## 2016-10-22 MED ORDER — AZITHROMYCIN 250 MG PO TABS
1000.0000 mg | ORAL_TABLET | Freq: Once | ORAL | Status: AC
  Administered 2016-10-22: 1000 mg via ORAL
  Filled 2016-10-22: qty 4

## 2016-10-22 MED ORDER — CEPHALEXIN 500 MG PO CAPS: 500.0000 mg | ORAL_CAPSULE | Freq: Four times a day (QID) | ORAL | 0 refills | Status: DC

## 2016-10-22 MED ORDER — CEFTRIAXONE SODIUM 250 MG IJ SOLR
250.0000 mg | Freq: Once | INTRAMUSCULAR | Status: AC
  Administered 2016-10-22: 250 mg via INTRAMUSCULAR
  Filled 2016-10-22: qty 250

## 2016-10-22 NOTE — ED Provider Notes (Signed)
[facility]-EMERGENCY DEPT Provider Note   CSN: [id number] Arrival date & time: 10/22/16  1721  By signing my name below, I, Guy Sutton, attest that this documentation has been prepared under the direction and in the presence of [facility], PA-C. Electronically Signed: Cynda Sutton, Scribe. 10/22/16. 10:42 PM.  History   Chief Complaint Chief Complaint  Patient presents with  . Dysuria   HPI Comments: Guy Sutton is a 21 y.o. male with who presents to the Emergency Department complaining of sudden-onset dysuria that began 2 days ago. Patient states he has never had this problem before.  Patient reports associated hematuria at the end of stream which began today. No modifying factors indicated. Patient states he has had unprotected sex recently, unsure if had an STD exposure. Patient denies any penile discharge, abdominal pain, nausea, vomiting, fever, chills, or back pain.   The history is provided by the patient. No language interpreter was used.    History reviewed. No pertinent past medical history.  Patient Active Problem List   Diagnosis Date Noted  . 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 Sepsis (HCC) 12/25/2015    History reviewed. No pertinent surgical history.     Home Medications    Prior to Admission medications   Medication Sig Start Date End Date Taking? Authorizing Provider  cephALEXin (KEFLEX) 500 MG capsule Take 1 capsule (500 mg total) by mouth 4 (four) times daily. 10/22/16   Chase Picket Ward, PA-C  clindamycin (CLEOCIN) 300 MG capsule Take 1 capsule (300 mg total) by mouth 3 (three) times daily. Patient not taking: Reported on 10/22/2016 12/28/15   Maretta Bees, MD    Family History No family history on file.  Social History Social History  Substance Use Topics  . Smoking status: Never Smoker  . Smokeless tobacco: Never Used  . Alcohol use No     Allergies   Patient has no known allergies.   Review of Systems Review of Systems    Constitutional: Negative for chills and fever.  Gastrointestinal: Negative for nausea and vomiting.  Genitourinary: Positive for dysuria and hematuria. Negative for discharge and flank pain.  Musculoskeletal: Negative for back pain.  All other systems reviewed and are negative.    Physical Exam Updated Vital Signs BP (!) 143/88 (BP Location: Left Arm)   Pulse 73   Temp 99.2 F (37.3 C) (Oral)   Resp 12   Wt 64.3 kg   SpO2 100%   BMI 18.21 kg/m   Physical Exam  Constitutional: He appears well-developed and well-nourished. No distress.  HENT:  Head: Normocephalic and atraumatic.  Neck: Neck supple.  Cardiovascular: Normal rate, regular rhythm and normal heart sounds.   No murmur heard. Pulmonary/Chest: Effort normal and breath sounds normal. No respiratory distress. He has no wheezes. He has no rales.  Abdominal: There is no tenderness.  No CVA tenderness.   Genitourinary:  Genitourinary Comments: Chaperone present for exam. + penile discharge.  No signs of lesion or erythema on the penis or testicles. The penis and testicles are nontender. No testicular masses or swelling. No signs of any inguinal hernias. Cremaster reflex present bilaterally.   Musculoskeletal: Normal range of motion.  Neurological: He is alert.  Skin: Skin is warm and dry.  Nursing note and vitals reviewed.    ED Treatments / Results  DIAGNOSTIC STUDIES: Oxygen Saturation is 100% on RA, normal by my interpretation.    COORDINATION OF CARE: 10:42 PM Discussed treatment plan with pt at bedside and pt  agreed to plan, which includes antibiotics.   Labs (all labs ordered are listed, but only abnormal results are displayed) Labs Reviewed  URINALYSIS, ROUTINE W REFLEX MICROSCOPIC - Abnormal; Notable for the following:       Result Value   APPearance HAZY (*)    Specific Gravity, Urine 1.035 (*)    Hgb urine dipstick SMALL (*)    Bilirubin Urine SMALL (*)    Protein, ur 30 (*)    Leukocytes, UA  LARGE (*)    Squamous Epithelial / LPF 0-5 (*)    All other components within normal limits  GC/CHLAMYDIA PROBE AMP ([place]) NOT AT [facility]    EKG  EKG Interpretation None       Radiology No results found.  Procedures Procedures (including critical care time)  Medications Ordered in ED Medications  cefTRIAXone (ROCEPHIN) injection 250 mg (250 mg Intramuscular Given 10/22/16 2324)  azithromycin (ZITHROMAX) tablet 1,000 mg (1,000 mg Oral Given 10/22/16 2324)  sterile water (preservative free) injection (10 mLs  Given 10/22/16 2324)     Initial Impression / Assessment and Plan / ED Course  I have reviewed the triage vital signs and the nursing notes.  Pertinent labs & imaging results that were available during my care of the patient were reviewed by me and considered in my medical decision making (see chart for details).      Patient is a 21 y.o. male who presents to ED for dysuria x2 days. UA with large leuks and TNTC WBC's. Will treat with keflex. Patient does have recent unprotected intercourse. Marland Kitchen He is afebrile without abdominal tenderness, abdominal pain or painful bowel movements to indicate prostatitis.  No tenderness to palpation of the testes or epididymis to suggest orchitis or epididymitis.  STD cultures obtained including gonorrhea and chlamydia. Patient to be discharged with instructions to follow up with PCP or health department. Discussed importance of using protection when sexually active. Patient understands that they have GC/Chlamydia cultures pending and that they will need to inform all sexual partners if results return positive. Patient has been treated prophylactically with azithromycin and Rocephin. Return precautions given and all questions answered.    Final Clinical Impressions(s) / ED Diagnoses   Final diagnoses:  Penile discharge  Dysuria    New Prescriptions New Prescriptions   CEPHALEXIN (KEFLEX) 500 MG CAPSULE    Take 1 capsule (500 mg total) by  mouth 4 (four) times daily.   I personally performed the services described in this documentation, which was scribed in my presence. The recorded information has been reviewed and is accurate.    [facility] Ward, PA-C 10/22/16 2345    Lorre Nick, MD 10/23/16 [phone]

## 2016-10-22 NOTE — ED Triage Notes (Signed)
Pt complaint of "burning" with peeing and "blood just after I finish." Pt denies discharge.

## 2016-10-22 NOTE — Discharge Instructions (Signed)
Please take all of your antibiotics until finished!  Use a condom with every sexual encounter Follow up with your doctor or the health department in regards to today's visit.   Please return to the ER for worsening symptoms, high fevers or persistent vomiting.  You have been tested for chlamydia and gonorrhea. These results will be available in approximately 3 days. You will be notified if they are positive.   It is very important to practice safe sex and use condoms when sexually active. If your results are positive you need to notify all sexual partners so they can be treated as well. The website https://garcia.net/ can be used to send anonymous text messages or emails to alert sexual contacts.   SEEK IMMEDIATE MEDICAL CARE IF:  You develop an oral temperature above 102 F (38.9 C) You develop fevers, back pain or an increase in pain.   New or worsening symptoms develop, you have any additional concerns.

## 2016-10-23 NOTE — ED Notes (Signed)
Patient verbalizes understanding of discharge instructions, prescriptions, home care and follow up care. Patient out of department at this time. 

## 2017-08-20 DIAGNOSIS — H1031 Unspecified acute conjunctivitis, right eye: Secondary | ICD-10-CM | POA: Diagnosis not present

## 2017-08-20 DIAGNOSIS — H5711 Ocular pain, right eye: Secondary | ICD-10-CM | POA: Diagnosis present

## 2017-08-21 ENCOUNTER — Other Ambulatory Visit: Payer: Self-pay

## 2017-08-21 ENCOUNTER — Emergency Department (HOSPITAL_COMMUNITY)
Admission: EM | Admit: 2017-08-21 | Discharge: 2017-08-21 | Disposition: A | Discharge status: Home / Self Care | Payer: Medicaid Other | Attending: Emergency Medicine | Admitting: Emergency Medicine

## 2017-08-21 ENCOUNTER — Encounter (HOSPITAL_COMMUNITY): Payer: Self-pay

## 2017-08-21 DIAGNOSIS — H1031 Unspecified acute conjunctivitis, right eye: Secondary | ICD-10-CM

## 2017-08-21 MED ORDER — FLUORESCEIN SODIUM 1 MG OP STRP
ORAL_STRIP | OPHTHALMIC | Status: AC
  Filled 2017-08-21: qty 1

## 2017-08-21 MED ORDER — OLOPATADINE HCL 0.1 % OP SOLN: 1.0000 [drp] | Freq: Two times a day (BID) | OPHTHALMIC | 12 refills | Status: AC

## 2017-08-21 NOTE — ED Provider Notes (Signed)
[facility]-EMERGENCY DEPT Provider Note   CSN: [id number] Arrival date & time: 08/20/17  2238     History   Chief Complaint Chief Complaint  Patient presents with  . Eye Pain    R    HPI Guy Sutton is a 22 y.o. male.  Patient presents for evaluation of painless, right eye redness without visual changes. Symptoms x 2 days. He denies discharge or excessive tearing. No facial swelling. No known trauma. He states that he may have gotten a bead from a shower soap in the eye.   The history is provided by the patient. No language interpreter was used.    History reviewed. No pertinent past medical history.  Patient Active Problem List   Diagnosis Date Noted  . 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 Sepsis (HCC) 12/25/2015    History reviewed. No pertinent surgical history.     Home Medications    Prior to Admission medications   Medication Sig Start Date End Date Taking? Authorizing Provider  cephALEXin (KEFLEX) 500 MG capsule Take 1 capsule (500 mg total) by mouth 4 (four) times daily. 10/22/16   Ward, Chase Picket, PA-C  clindamycin (CLEOCIN) 300 MG capsule Take 1 capsule (300 mg total) by mouth 3 (three) times daily. Patient not taking: Reported on 10/22/2016 12/28/15   Maretta Bees, MD  olopatadine (PATANOL) 0.1 % ophthalmic solution Place 1 drop into the right eye 2 (two) times daily for 5 days. 08/21/17 08/26/17  Elpidio Anis, PA-C    Family History History reviewed. No pertinent family history.  Social History Social History   Tobacco Use  . Smoking status: Never Smoker  . Smokeless tobacco: Never Used  Substance Use Topics  . Alcohol use: No  . Drug use: No     Allergies   Patient has no known allergies.   Review of Systems Review of Systems  Constitutional: Negative for fever.  HENT: Negative for facial swelling and sneezing.   Eyes: Positive for redness and itching. Negative for pain, discharge and visual  disturbance.  Skin: Negative for rash.  Neurological: Negative for headaches.     Physical Exam Updated Vital Signs BP 118/76   Pulse 64   Temp (!) 97.3 F (36.3 C) (Oral)   Resp 18   SpO2 100%   Physical Exam  Constitutional: He is oriented to person, place, and time. He appears well-developed and well-nourished.  Eyes:  Right eye is injected. Cornea clear, no hyphema or cloudiness. No lid swelling, however, there is faint erythema of upper and lower lids. Nontender.  Neck: Normal range of motion.  Pulmonary/Chest: Effort normal.  Musculoskeletal: Normal range of motion.  Neurological: He is alert and oriented to person, place, and time.  Skin: Skin is warm and dry.  Psychiatric: He has a normal mood and affect.     ED Treatments / Results  Labs (all labs ordered are listed, but only abnormal results are displayed) Labs Reviewed - No data to display  EKG  EKG Interpretation None       Radiology No results found.  Procedures Procedures (including critical care time)  Medications Ordered in ED Medications - No data to display   Initial Impression / Assessment and Plan / ED Course  I have reviewed the triage vital signs and the nursing notes.  Pertinent labs & imaging results that were available during my care of the patient were reviewed by me and considered in my medical decision making (see chart for  details).     Right eye redness that is painless, mild itchiness, no discharge x 2 days. Exam is reassuring. Without pain and redness with mild itching, suspect mild allergic conjunctivitis. Will Rx Patanol. REfer to ophtho prn for worsening/persistent symptoms.  Final Clinical Impressions(s) / ED Diagnoses   Final diagnoses:  Acute conjunctivitis of right eye, unspecified acute conjunctivitis type    ED Discharge Orders        Ordered    olopatadine (PATANOL) 0.1 % ophthalmic solution  2 times daily     08/21/17 0458       Elpidio AnisUpstill, Swetha Rayle,  PA-C 08/21/17 2358    Ward, Layla MawKristen N, DO 08/22/17 [phone]

## 2017-08-21 NOTE — ED Triage Notes (Signed)
Pt c/o R eye swelling x2 days. His girlfriend reports that he may have gotten a microbead from her facewash in it. He denies vision changes or pain. No known allergies. R eye does appears slightly swollen and sclera is bloodshot.

## 2017-09-20 ENCOUNTER — Encounter (HOSPITAL_COMMUNITY): Payer: Self-pay

## 2017-09-20 ENCOUNTER — Emergency Department (HOSPITAL_COMMUNITY)
Admission: EM | Admit: 2017-09-20 | Discharge: 2017-09-20 | Disposition: A | Discharge status: Home / Self Care | Payer: Medicaid Other | Attending: Emergency Medicine | Admitting: Emergency Medicine

## 2017-09-20 ENCOUNTER — Emergency Department (HOSPITAL_COMMUNITY): Payer: Medicaid Other

## 2017-09-20 ENCOUNTER — Other Ambulatory Visit: Payer: Self-pay

## 2017-09-20 DIAGNOSIS — J40 Bronchitis, not specified as acute or chronic: Secondary | ICD-10-CM | POA: Insufficient documentation

## 2017-09-20 DIAGNOSIS — R05 Cough: Secondary | ICD-10-CM | POA: Diagnosis present

## 2017-09-20 MED ORDER — HYDROCODONE-HOMATROPINE 5-1.5 MG/5ML PO SYRP: 5.0000 mL | ORAL_SOLUTION | Freq: Four times a day (QID) | ORAL | 0 refills | Status: AC | PRN

## 2017-09-20 NOTE — Discharge Instructions (Signed)
You can take the cough syrup every 6 hours as needed for cough.  Be aware this medication has hydrocodone in it.  Do not drive or drink alcohol while taking this medication. Return to the ER for difficulty breathing or new or concerning symptoms.

## 2017-09-20 NOTE — ED Triage Notes (Signed)
Patient reports that he has had a cough since Beginning of February. Patient has been using OTC cough syrup. Patient reports that he has a constant a non productive cough. Patient states yesterday, he was coughing frequently and it made him vomit.

## 2017-09-20 NOTE — ED Provider Notes (Signed)
[facility]-EMERGENCY DEPT Provider Note   CSN: [id number] Arrival date & time: 09/20/17  [id number]     History   Chief Complaint Chief Complaint  Patient presents with  . Cough    HPI Guy Sutton is a 22 y.o. male presenting to the ED with 1 month of persistent dry cough.  Patient states he had a cold in the beginning of February, and although symptoms have resolved except for the cough. He does state that the cough has been so persistent it has caused him to have a few episodes of posttussive emesis.  He states he does not have any wheezing, difficulty breathing or swallowing, chest pain, or other complaints.    The history is provided by the patient.    History reviewed. No pertinent past medical history.  Patient Active Problem List   Diagnosis Date Noted  . 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 Sepsis (HCC) 12/25/2015    History reviewed. No pertinent surgical history.     Home Medications    Prior to Admission medications   Medication Sig Start Date End Date Taking? Authorizing Provider  Pseudoeph-Doxylamine-DM-APAP (NYQUIL PO) Take 15 mLs by mouth daily as needed (COLD).    Yes [provider]  cephALEXin (KEFLEX) 500 MG capsule Take 1 capsule (500 mg total) by mouth 4 (four) times daily. Patient not taking: Reported on 09/20/2017 10/22/16   Ward, Chase PicketJaime Pilcher, PA-C  clindamycin (CLEOCIN) 300 MG capsule Take 1 capsule (300 mg total) by mouth 3 (three) times daily. Patient not taking: Reported on 09/20/2017 12/28/15   Maretta BeesGhimire, Shanker M, MD  HYDROcodone-homatropine [facility](HYCODAN) 5-1.5 MG/5ML syrup Take 5 mLs by mouth every 6 (six) hours as needed for up to 5 days for cough. 09/20/17 09/25/17  Robinson, [place] N, PA-C    Family History History reviewed. No pertinent family history.  Social History Social History   Tobacco Use  . Smoking status: Never Smoker  . Smokeless tobacco: Never Used  Substance Use Topics  . Alcohol use: No  . Drug  use: No     Allergies   Patient has no known allergies.   Review of Systems Review of Systems  HENT: Negative for congestion and sore throat.   Respiratory: Positive for cough. Negative for chest tightness, shortness of breath, wheezing and stridor.      Physical Exam Updated Vital Signs BP (!) 146/97   Pulse (!) 111   Temp 98.6 F (37 C) (Oral)   Resp 15   Ht 6\' 2"  (1.88 m)   Wt 77.1 kg (170 lb)   SpO2 100%   BMI 21.83 kg/m   Physical Exam  Constitutional: He appears well-developed and well-nourished. No distress.  HENT:  Head: Normocephalic and atraumatic.  Mouth/Throat: Oropharynx is clear and moist.  Eyes: Conjunctivae are normal.  Neck: Normal range of motion. Neck supple.  Cardiovascular: Normal rate, regular rhythm, normal heart sounds and intact distal pulses.  Pulmonary/Chest: Effort normal and breath sounds normal. No stridor. No respiratory distress. He has no wheezes. He has no rales.  Lymphadenopathy:    He has no cervical adenopathy.  Psychiatric: He has a normal mood and affect. His behavior is normal.  Nursing note and vitals reviewed.    ED Treatments / Results  Labs (all labs ordered are listed, but only abnormal results are displayed) Labs Reviewed - No data to display  EKG  EKG Interpretation None       Radiology Dg Chest 2 View  Result  Date: 09/20/2017 CLINICAL DATA:  Cough for a month. EXAM: CHEST  2 VIEW COMPARISON:  None. FINDINGS: The heart size and mediastinal contours are within normal limits. Both lungs are clear. The visualized skeletal structures are unremarkable. IMPRESSION: No active cardiopulmonary disease. No evidence of pneumonia or pulmonary edema. Electronically Signed   By: Bary Richard M.D.   On: 09/20/2017 10:46    Procedures Procedures (including critical care time)  Medications Ordered in ED Medications - No data to display   Initial Impression / Assessment and Plan / ED Course  I have reviewed the triage  vital signs and the nursing notes.  Pertinent labs & imaging results that were available during my care of the patient were reviewed by me and considered in my medical decision making (see chart for details).     Patient with symptoms consistent with bronchitis.  Lungs clear to auscultation bilaterally, no increased work of breathing.  ENT exam benign.  Chest x-ray negative for infiltrate.  Will discharge with cough suppressant and recommendation to follow-up with PCP.  Strict return precautions discussed.  Safe for discharge.  [place] Controlled Substance reporting System queried  Discussed results, findings, treatment and follow up. Patient advised of return precautions. Patient verbalized understanding and agreed with plan.  Final Clinical Impressions(s) / ED Diagnoses   Final diagnoses:  Bronchitis    ED Discharge Orders        Ordered    HYDROcodone-homatropine (HYCODAN) 5-1.5 MG/5ML syrup  Every 6 hours PRN     09/20/17 1112      Robinson, [place] N, [place] 09/20/17 1113  Raeford Razor, MD 09/20/17 1357

## 2018-05-14 IMAGING — CR DG CHEST 2V
2 series · 2 of 2 positions shown · non-contrast
Comparison: None.

CLINICAL DATA: Cough for a month.

EXAM:
CHEST  2 VIEW

[w chest pa]
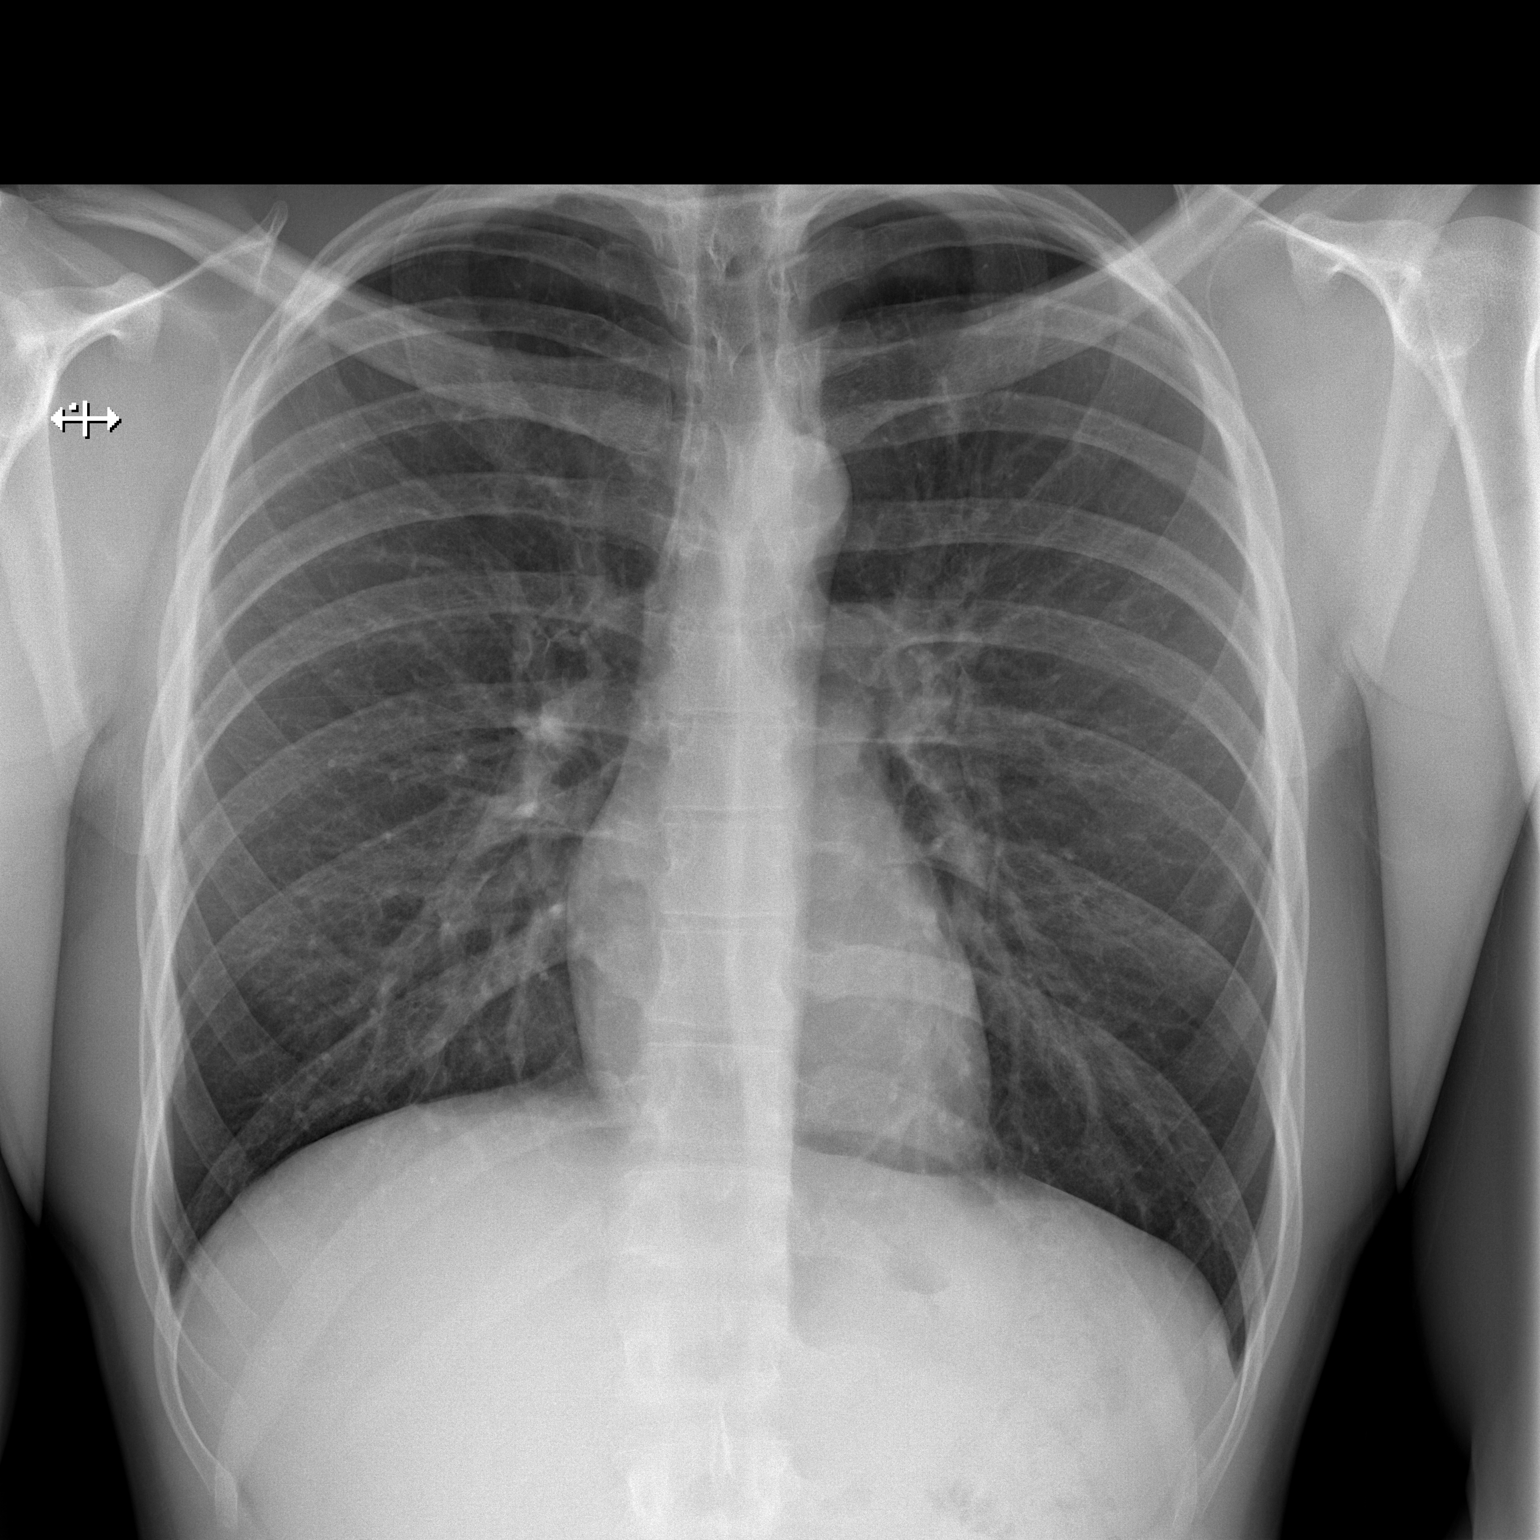

[w chest lat]
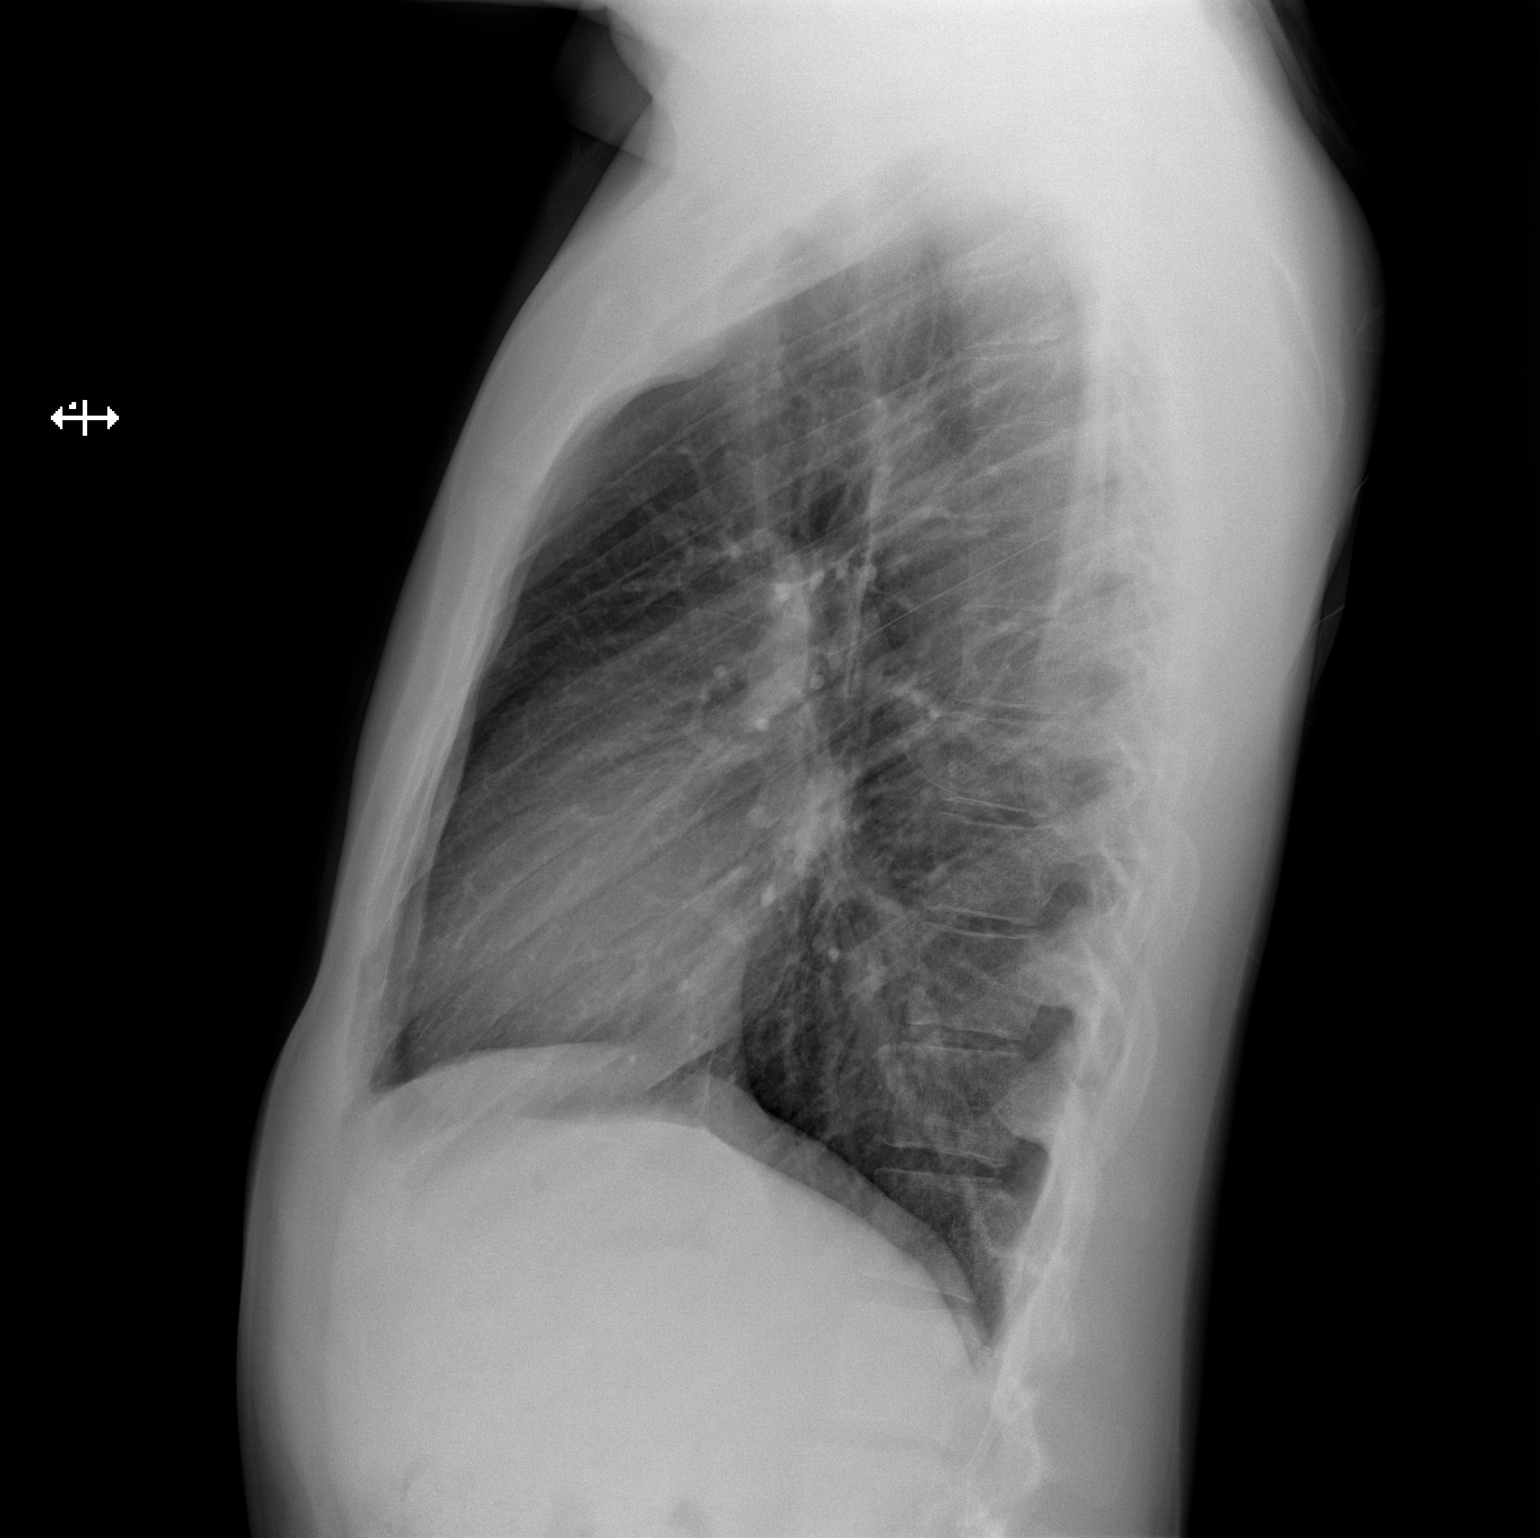

[2 of 2 positions shown; findings below may reference images not displayed]

FINDINGS: The heart size and mediastinal contours are within normal limits.
Both lungs are clear. The visualized skeletal structures are
unremarkable.
IMPRESSION: No active cardiopulmonary disease. No evidence of pneumonia or
pulmonary edema.

## 2019-05-15 ENCOUNTER — Encounter (HOSPITAL_COMMUNITY): Payer: Self-pay | Admitting: Emergency Medicine

## 2019-05-15 ENCOUNTER — Other Ambulatory Visit: Payer: Self-pay

## 2019-05-15 ENCOUNTER — Emergency Department (HOSPITAL_COMMUNITY)
Admission: EM | Admit: 2019-05-15 | Discharge: 2019-05-16 | Disposition: A | Discharge status: Home / Self Care | Attending: Emergency Medicine | Admitting: Emergency Medicine

## 2019-05-15 DIAGNOSIS — L259 Unspecified contact dermatitis, unspecified cause: Secondary | ICD-10-CM

## 2019-05-15 DIAGNOSIS — B360 Pityriasis versicolor: Secondary | ICD-10-CM | POA: Diagnosis not present

## 2019-05-15 DIAGNOSIS — R21 Rash and other nonspecific skin eruption: Secondary | ICD-10-CM | POA: Diagnosis present

## 2019-05-15 MED ORDER — PREDNISONE 20 MG PO TABS: ORAL_TABLET | ORAL | 0 refills | Status: DC

## 2019-05-15 MED ORDER — LORATADINE 10 MG PO TABS
10.0000 mg | ORAL_TABLET | Freq: Once | ORAL | Status: AC
  Administered 2019-05-16: 10 mg via ORAL
  Filled 2019-05-15: qty 1

## 2019-05-15 MED ORDER — PREDNISONE 20 MG PO TABS
60.0000 mg | ORAL_TABLET | Freq: Once | ORAL | Status: AC
  Administered 2019-05-16: 60 mg via ORAL
  Filled 2019-05-15: qty 3

## 2019-05-15 NOTE — ED Triage Notes (Signed)
Patient presents with a rash under right axillary. He does report itching and irritation. He says he had this same issue a year ago. Then he was able to treat it with OTC cream. Today he would like to know what it is.

## 2019-05-15 NOTE — ED Provider Notes (Signed)
[facility] DEPT Provider Note   CSN: [id number] Arrival date & time: 05/15/19  2104     History   Chief Complaint Chief Complaint  Patient presents with  . Rash    HPI Guy Sutton is a 23 y.o. male.     The history is provided by the patient.  Rash Location: right axilla. Quality: dryness and itchiness   Severity:  Mild Onset quality:  Gradual Duration:  5 days Timing:  Constant Progression:  Worsening Chronicity:  Recurrent Context: not animal contact, not diapers, not eggs, not exposure to similar rash, not food, not hot tub use, not insect bite/sting, not medications and not nuts   Relieved by:  Nothing Exacerbated by: bandaids and neosporin. Associated symptoms: no abdominal pain, no diarrhea, no fatigue, no fever, no headaches, no hoarse voice, no induration, no joint pain, no myalgias, no nausea, no periorbital edema, no shortness of breath, no sore throat, no throat swelling, no tongue swelling, no URI, not vomiting and not wheezing     History reviewed. No pertinent past medical history.  Patient Active Problem List   Diagnosis Date Noted  . 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 Sepsis ([place]) 12/25/2015    History reviewed. No pertinent surgical history.      Home Medications    Prior to Admission medications   Medication Sig Start Date End Date Taking? Authorizing Provider  cephALEXin (KEFLEX) 500 MG capsule Take 1 capsule (500 mg total) by mouth 4 (four) times daily. Patient not taking: Reported on 09/20/2017 10/22/16   Ward, Ozella Almond, PA-C  clindamycin (CLEOCIN) 300 MG capsule Take 1 capsule (300 mg total) by mouth 3 (three) times daily. Patient not taking: Reported on 09/20/2017 12/28/15   Jonetta Osgood, MD  predniSONE (DELTASONE) 20 MG tablet 2 tabs po daily x 3 days 05/15/19   Zhane Bluitt, MD  Pseudoeph-Doxylamine-DM-APAP (NYQUIL PO) Take 15 mLs by mouth daily as needed (COLD).     [provider]    Family History History reviewed. No pertinent family history.  Social History Social History   Tobacco Use  . Smoking status: Never Smoker  . Smokeless tobacco: Never Used  Substance Use Topics  . Alcohol use: No  . Drug use: No     Allergies   Patient has no known allergies.   Review of Systems Review of Systems  Constitutional: Negative for fatigue and fever.  HENT: Negative for hoarse voice and sore throat.   Eyes: Negative for visual disturbance.  Respiratory: Negative for shortness of breath and wheezing.   Gastrointestinal: Negative for abdominal pain, diarrhea, nausea and vomiting.  Genitourinary: Negative for difficulty urinating.  Musculoskeletal: Negative for arthralgias and myalgias.  Skin: Positive for rash.  Neurological: Negative for headaches.  Psychiatric/Behavioral: Negative for agitation.  All other systems reviewed and are negative.    Physical Exam Updated Vital Signs BP (!) 153/98   Pulse 85   Temp 98.8 F (37.1 C) (Oral)   Resp 18   Ht 6\' 2"  (1.88 m)   Wt 95.3 kg   SpO2 99%   BMI 26.96 kg/m   Physical Exam Vitals signs and nursing note reviewed.  Constitutional:      General: He is not in acute distress.    Appearance: He is normal weight.  HENT:     Head: Normocephalic and atraumatic.     Nose: Nose normal.     Mouth/Throat:     Mouth: Mucous membranes are moist.  Pharynx: Oropharynx is clear.  Eyes:     Conjunctiva/sclera: Conjunctivae normal.     Pupils: Pupils are equal, round, and reactive to light.  Neck:     Musculoskeletal: Normal range of motion and neck supple.  Cardiovascular:     Rate and Rhythm: Normal rate and regular rhythm.     Pulses: Normal pulses.     Heart sounds: Normal heart sounds.  Pulmonary:     Effort: Pulmonary effort is normal.     Breath sounds: Normal breath sounds.  Abdominal:     General: Abdomen is flat. Bowel sounds are normal.     Tenderness: There is no abdominal  tenderness.  Musculoskeletal: Normal range of motion.  Skin:    General: Skin is warm and dry.     Capillary Refill: Capillary refill takes less than 2 seconds.       Neurological:     General: No focal deficit present.     Mental Status: He is alert and oriented to person, place, and time.  Psychiatric:        Mood and Affect: Mood normal.        Behavior: Behavior normal.      ED Treatments / Results  Labs (all labs ordered are listed, but only abnormal results are displayed) Labs Reviewed - No data to display  EKG None  Radiology No results found.  Procedures Procedures (including critical care time)  Medications Ordered in ED Medications  loratadine (CLARITIN) tablet 10 mg (has no administration in time range)  predniSONE (DELTASONE) tablet 60 mg (has no administration in time range)    Contact dermatitis likely from bandaid overlying tinea versicolor.  selsum blue for tinea and steroids, no bandaids and no deodorant in the axilla.  No signs of anaphylaxis.    Guy Sutton was evaluated in Emergency Department on 05/16/2019 for the symptoms described in the history of present illness. He was evaluated in the context of the global COVID-19 pandemic, which necessitated consideration that the patient might be at risk for infection with the SARS-CoV-2 virus that causes COVID-19. Institutional protocols and algorithms that pertain to the evaluation of patients at risk for COVID-19 are in a state of rapid change based on information released by regulatory bodies including the CDC and federal and state organizations. These policies and algorithms were followed during the patient's care in the ED.  Final Clinical Impressions(s) / ED Diagnoses   Final diagnoses:  Contact dermatitis, unspecified contact dermatitis type, unspecified trigger  Tinea versicolor   Return for weakness, numbness, changes in vision or speech, fevers >100.4 unrelieved by medication, shortness of  breath, intractable vomiting, or diarrhea, abdominal pain, Inability to tolerate liquids or food, cough, altered mental status or any concerns. No signs of systemic illness or infection. The patient is nontoxic-appearing on exam and vital signs are within normal limits.   I have reviewed the triage vital signs and the nursing notes. Pertinent labs &imaging results that were available during my care of the patient were reviewed by me and considered in my medical decision making (see chart for details).  After history, exam, and medical workup I feel the patient has been appropriately medically screened and is safe for discharge home. Pertinent diagnoses were discussed with the patient. Patient was given return precautions  ED Discharge Orders         Ordered    predniSONE (DELTASONE) 20 MG tablet     05/15/19 2356  Alycen Mack, MD 05/16/19 [id number]

## 2019-05-15 NOTE — Discharge Instructions (Signed)
All free and clear detergent Selsum blue shampoo to areas on drunk Take claritin No deoderant no bandaids

## 2019-05-16 NOTE — ED Notes (Signed)
Pt verbalized discharge instructions and follow up care. Alert and ambulatory. No IV.  

## 2019-05-24 ENCOUNTER — Other Ambulatory Visit: Payer: Self-pay

## 2019-05-24 ENCOUNTER — Ambulatory Visit (HOSPITAL_COMMUNITY)
Admission: EM | Admit: 2019-05-24 | Discharge: 2019-05-24 | Disposition: A | Discharge status: Home / Self Care | Attending: Emergency Medicine | Admitting: Emergency Medicine

## 2019-05-24 ENCOUNTER — Encounter (HOSPITAL_COMMUNITY): Payer: Self-pay

## 2019-05-24 DIAGNOSIS — R21 Rash and other nonspecific skin eruption: Secondary | ICD-10-CM | POA: Diagnosis not present

## 2019-05-24 DIAGNOSIS — B360 Pityriasis versicolor: Secondary | ICD-10-CM | POA: Diagnosis not present

## 2019-05-24 MED ORDER — FLUCONAZOLE 150 MG PO TABS: 150.0000 mg | ORAL_TABLET | ORAL | 0 refills | Status: AC

## 2019-05-24 MED ORDER — TRIAMCINOLONE ACETONIDE 0.1 % EX CREA: 1.0000 "application " | TOPICAL_CREAM | Freq: Two times a day (BID) | CUTANEOUS | 0 refills | Status: DC

## 2019-05-24 MED ORDER — SELENIUM SULFIDE 2.5 % EX LOTN: TOPICAL_LOTION | CUTANEOUS | 12 refills | Status: DC

## 2019-05-24 NOTE — ED Triage Notes (Signed)
Pt states she has a rash on his ears , back and chest. This has been going on for a week or more.

## 2019-05-24 NOTE — Discharge Instructions (Signed)
Please take diflucan once weekly for the next 3-4 weeks by mouth Apply triamcinol9one areas to area on armpit and ears/neck for itching twice daily Selenium sulfide shampoo daily for the next week  Follow up if not resolving

## 2019-05-25 NOTE — ED Provider Notes (Signed)
Guy Sutton    CSN: [id number] Arrival date & time: 05/24/19  1433      History   Chief Complaint Chief Complaint  Patient presents with  . Rash    HPI Guy Sutton is a 23 y.o. male no significant PMH, presenting today for evaluation of a rash. Patient states that over the past week he has developed a rash to his right axilla as well as neck and ears.  He states that he has chronically had a rash to his chest and back which has been present since he was a child.  It does not bother him.  He does not use anything for it.  He has developed more itching related to area just below right axilla ears and neck.  He is unsure if the rash behind his ears could have been related to his mask use.  He has been trying to avoid using a mask that attaches behind ears and using 1 that ties behind head instead.  He denies any associated pain with areas.  He has not been using anything other than A&D barrier ointment to help with symptoms.  Denies any new soaps lotions or detergents.  Denies any new foods or medicines.  Denies difficulty breathing any oral swelling.  Denies any lesions in mouth or palms or soles.  HPI  History reviewed. No pertinent past medical history.  Patient Active Problem List   Diagnosis Date Noted  . 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 Sepsis ([place]) 12/25/2015    History reviewed. No pertinent surgical history.     Home Medications    Prior to Admission medications   Medication Sig Start Date End Date Taking? Authorizing Provider  cephALEXin (KEFLEX) 500 MG capsule Take 1 capsule (500 mg total) by mouth 4 (four) times daily. Patient not taking: Reported on 09/20/2017 10/22/16   Ward, Ozella Almond, PA-C  clindamycin (CLEOCIN) 300 MG capsule Take 1 capsule (300 mg total) by mouth 3 (three) times daily. Patient not taking: Reported on 09/20/2017 12/28/15   Jonetta Osgood, MD  fluconazole (DIFLUCAN) 150 MG tablet Take 1 tablet (150 mg total) by mouth once  a week for 4 doses. 05/24/19 06/15/19  Nitesh Pitstick C, PA-C  predniSONE (DELTASONE) 20 MG tablet 2 tabs po daily x 3 days 05/15/19   Palumbo, April, MD  Pseudoeph-Doxylamine-DM-APAP (NYQUIL PO) Take 15 mLs by mouth daily as needed (COLD).     [provider]  selenium sulfide (SELSUN) 2.5 % shampoo Apply to affected area and lather with small amounts of water; leave on skin for 10 minutes, then rinse thoroughly; repeat once every day for 7 days. 05/24/19   Sherial Ebrahim C, PA-C  triamcinolone cream (KENALOG) 0.1 % Apply 1 application topically 2 (two) times daily. 05/24/19   Devon Pretty, Elesa Hacker, PA-C    Family History History reviewed. No pertinent family history.  Social History Social History   Tobacco Use  . Smoking status: Never Smoker  . Smokeless tobacco: Never Used  Substance Use Topics  . Alcohol use: Yes  . Drug use: No     Allergies   Patient has no known allergies.   Review of Systems Review of Systems  Constitutional: Negative for fatigue and fever.  Eyes: Negative for redness, itching and visual disturbance.  Respiratory: Negative for shortness of breath.   Cardiovascular: Negative for chest pain and leg swelling.  Gastrointestinal: Negative for nausea and vomiting.  Musculoskeletal: Negative for arthralgias and myalgias.  Skin: Positive for  color change and rash. Negative for wound.  Neurological: Negative for dizziness, syncope, weakness, light-headedness and headaches.     Physical Exam Triage Vital Signs ED Triage Vitals  Enc Vitals Group     BP 05/24/19 1446 (!) 146/82     Pulse Rate 05/24/19 1446 86     Resp 05/24/19 1446 18     Temp 05/24/19 1446 98.2 F (36.8 C)     Temp src --      SpO2 05/24/19 1446 100 %     Weight 05/24/19 1443 210 lb (95.3 kg)     Height --      Head Circumference --      Peak Flow --      Pain Score 05/24/19 1443 2     Pain Loc --      Pain Edu? --      Excl. in GC? --    No data found.  Updated Vital  Signs BP (!) 146/82 (BP Location: Right Arm)   Pulse 86   Temp 98.2 F (36.8 C)   Resp 18   Wt 210 lb (95.3 kg)   SpO2 100%   BMI 26.96 kg/m   Visual Acuity Right Eye Distance:   Left Eye Distance:   Bilateral Distance:    Right Eye Near:   Left Eye Near:    Bilateral Near:     Physical Exam Vitals signs and nursing note reviewed.  Constitutional:      Appearance: He is well-developed.  HENT:     Head: Normocephalic and atraumatic.  Eyes:     Conjunctiva/sclera: Conjunctivae normal.  Neck:     Musculoskeletal: Neck supple.  Cardiovascular:     Rate and Rhythm: Normal rate and regular rhythm.     Heart sounds: No murmur.  Pulmonary:     Effort: Pulmonary effort is normal. No respiratory distress.     Breath sounds: Normal breath sounds.  Abdominal:     Palpations: Abdomen is soft.     Tenderness: There is no abdominal tenderness.  Skin:    General: Skin is warm and dry.     Comments: See pictures below, trunk with various areas of irregular hyperpigmentation suggestive of tinea versicolor, this is not new for patient  Area inferior to right axilla appears hyperpigmented and dry  Neck appears hyperpigmented, papular skin colored bumps noted to posterior auricles bilaterally  Does have 2 pink exposed areas to upper back without surrounding erythema or induration  Neurological:     Mental Status: He is alert.              UC Treatments / Results  Labs (all labs ordered are listed, but only abnormal results are displayed) Labs Reviewed - No data to display  EKG   Radiology No results found.  Procedures Procedures (including critical care time)  Medications Ordered in UC Medications - No data to display  Initial Impression / Assessment and Plan / UC Course  I have reviewed the triage vital signs and the nursing notes.  Pertinent labs & imaging results that were available during my care of the patient were reviewed by me and considered in my  medical decision making (see chart for details).     Patient has tinea versicolor, which seems has been present since childhood, this is not of concern to patient today, but will provide selenium sulfide to use topically for 1 week.  Rash to axilla/ears suggestive of possible fungal versus eczematous rash.  Given itching  will provide triamcinolone to apply topically as well as provide Diflucan to take orally weekly x3 to 4 weeks.  Discussed case with Dr. Leonides Grills.  Does not seem allergic at this time, deferring oral steroids.  Continue to monitor rash,Discussed strict return precautions. Patient verbalized understanding and is agreeable with plan.  Final Clinical Impressions(s) / UC Diagnoses   Final diagnoses:  Rash and nonspecific skin eruption  Tinea versicolor     Discharge Instructions     Please take diflucan once weekly for the next 3-4 weeks by mouth Apply triamcinol9one areas to area on armpit and ears/neck for itching twice daily Selenium sulfide shampoo daily for the next week  Follow up if not resolving   ED Prescriptions    Medication Sig Dispense Auth. Provider   selenium sulfide (SELSUN) 2.5 % shampoo Apply to affected area and lather with small amounts of water; leave on skin for 10 minutes, then rinse thoroughly; repeat once every day for 7 days. 118 mL Deborh Pense C, PA-C   triamcinolone cream (KENALOG) 0.1 % Apply 1 application topically 2 (two) times daily. 45 g Dovey Fatzinger C, PA-C   fluconazole (DIFLUCAN) 150 MG tablet Take 1 tablet (150 mg total) by mouth once a week for 4 doses. 4 tablet Chaley Castellanos, [place] C, PA-C     PDMP not reviewed this encounter.   Guy Dawes, PA-C 05/25/19 1135

## 2019-08-17 ENCOUNTER — Ambulatory Visit (HOSPITAL_COMMUNITY)
Admission: EM | Admit: 2019-08-17 | Discharge: 2019-08-17 | Disposition: A | Discharge status: Home / Self Care | Attending: Internal Medicine | Admitting: Internal Medicine

## 2019-08-17 ENCOUNTER — Encounter (HOSPITAL_COMMUNITY): Payer: Self-pay | Admitting: Emergency Medicine

## 2019-08-17 ENCOUNTER — Other Ambulatory Visit: Payer: Self-pay

## 2019-08-17 DIAGNOSIS — K1120 Sialoadenitis, unspecified: Secondary | ICD-10-CM | POA: Diagnosis not present

## 2019-08-17 MED ORDER — CLINDAMYCIN HCL 300 MG PO CAPS: 300.0000 mg | ORAL_CAPSULE | Freq: Three times a day (TID) | ORAL | 0 refills | Status: DC

## 2019-08-17 MED ORDER — IBUPROFEN 600 MG PO TABS: 600.0000 mg | ORAL_TABLET | Freq: Four times a day (QID) | ORAL | 0 refills | Status: DC | PRN

## 2019-08-17 NOTE — ED Triage Notes (Signed)
Swollen lymph node to left side of jaw/neck area.  Noticed this last night and reports swelling has reduced since last night.  Patient can feel discomfort with swallowing.    Felt feverish yesterday.

## 2019-08-17 NOTE — ED Provider Notes (Signed)
[facility]    CSN: [id number] Arrival date & time: 08/17/19  1357      History   Chief Complaint Chief Complaint  Patient presents with  . Lymphadenopathy    HPI Guy Sutton is a 24 y.o. male with a history of recurrent sialoliths with sialadenitis comes to urgent care with 1-day history of painful left sided neck swelling.  Symptoms started overnight and appears to have improved over the course of the day.  Patient denies any aggravating or relieving factors.  No fever, chills, nausea or vomiting.  No trauma to the neck.  Denies any difficulty swallowing.  Pain is currently of moderate intensity.  No radiation of the pain. HPI  History reviewed. No pertinent past medical history.  Patient Active Problem List   Diagnosis Date Noted  . 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 Sepsis ([facility]) 12/25/2015    History reviewed. No pertinent surgical history.     Home Medications    Prior to Admission medications   Medication Sig Start Date End Date Taking? Authorizing Provider  clindamycin (CLEOCIN) 300 MG capsule Take 1 capsule (300 mg total) by mouth 3 (three) times daily. 08/17/19   Merrilee Jansky, MD  ibuprofen (ADVIL) 600 MG tablet Take 1 tablet (600 mg total) by mouth every 6 (six) hours as needed. 08/17/19   Daxx Tiggs, Britta Mccreedy, MD    Family History History reviewed. No pertinent family history.  Social History Social History   Tobacco Use  . Smoking status: Never Smoker  . Smokeless tobacco: Never Used  Substance Use Topics  . Alcohol use: Not Currently  . Drug use: No     Allergies   Patient has no known allergies.   Review of Systems Review of Systems  Constitutional: Negative.   HENT: Negative for drooling, ear discharge, ear pain, mouth sores, rhinorrhea, sore throat and trouble swallowing.   Eyes: Negative.  Negative for pain and itching.  Respiratory: Negative for cough, chest tightness, shortness of breath and wheezing.     Gastrointestinal: Negative for abdominal pain, diarrhea, nausea and vomiting.  Musculoskeletal: Negative.   Skin: Negative for pallor and wound.  Allergic/Immunologic: Negative for environmental allergies and food allergies.  Neurological: Negative for dizziness, weakness, numbness and headaches.     Physical Exam Triage Vital Signs ED Triage Vitals  Enc Vitals Group     BP 08/17/19 1450 121/70     Pulse Rate 08/17/19 1450 75     Resp 08/17/19 1450 18     Temp 08/17/19 1450 98.9 F (37.2 C)     Temp Source 08/17/19 1450 Oral     SpO2 08/17/19 1450 100 %     Weight --      Height --      Head Circumference --      Peak Flow --      Pain Score 08/17/19 1445 2     Pain Loc --      Pain Edu? --      Excl. in GC? --    No data found.  Updated Vital Signs BP 121/70 (BP Location: Right Arm)   Pulse 75   Temp 98.9 F (37.2 C) (Oral)   Resp 18   SpO2 100%   Visual Acuity Right Eye Distance:   Left Eye Distance:   Bilateral Distance:    Right Eye Near:   Left Eye Near:    Bilateral Near:     Physical Exam Vitals and nursing note reviewed.  Constitutional:      General: He is not in acute distress.    Appearance: Normal appearance. He is not ill-appearing.  HENT:     Right Ear: Tympanic membrane and ear canal normal.     Left Ear: Tympanic membrane and ear canal normal.     Nose: Nose normal. No rhinorrhea.     Mouth/Throat:     Mouth: Mucous membranes are moist.     Comments: No pharyngeal erythema.  No oral swelling. Eyes:     Extraocular Movements: Extraocular movements intact.     Conjunctiva/sclera: Conjunctivae normal.  Neck:     Comments: Firm tender swelling on the left side of the neck extending to the angle of the jaw.  Measures about 3 cm in the longest diameter.  No erythema.  No fluctuance.  Not attached to the overlying skin.  No cervical adenopathy Cardiovascular:     Rate and Rhythm: Normal rate and regular rhythm.     Pulses: Normal pulses.      Heart sounds: Normal heart sounds.  Pulmonary:     Effort: Pulmonary effort is normal.     Breath sounds: Normal breath sounds.  Abdominal:     General: Bowel sounds are normal. There is no distension.     Palpations: Abdomen is soft. There is no mass.     Tenderness: There is no abdominal tenderness.  Musculoskeletal:        General: Normal range of motion.     Cervical back: Normal range of motion and neck supple.  Skin:    General: Skin is warm and dry.     Capillary Refill: Capillary refill takes less than 2 seconds.  Neurological:     Mental Status: He is alert and oriented to person, place, and time.      UC Treatments / Results  Labs (all labs ordered are listed, but only abnormal results are displayed) Labs Reviewed - No data to display  EKG   Radiology No results found.  Procedures Procedures (including critical care time)  Medications Ordered in UC Medications - No data to display  Initial Impression / Assessment and Plan / UC Course  I have reviewed the triage vital signs and the nursing notes.  Pertinent labs & imaging results that were available during my care of the patient were reviewed by me and considered in my medical decision making (see chart for details).     1. 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 Chart reviewed shows previous episodes of rapidly progressive infection of the left submandibular salivary gland. I will start patient on clindamycin 300 mg 3 times a day for 10 days Ibuprofen 600 mg every 6 hours as needed for pain Strict return precautions If patient develops any worsening symptoms he is advised to go to the emergency department for imaging and further management Patient verbalizes understanding. Final Clinical Impressions(s) / UC Diagnoses   Final diagnoses:  Sialoadenitis  Salivary gland infection   Discharge Instructions   None    ED Prescriptions     Medication Sig Dispense Auth. Provider   clindamycin (CLEOCIN) 300 MG capsule Take 1 capsule (300 mg total) by mouth 3 (three) times daily. 40 capsule Jasilyn Holderman, Myrene Galas, MD   ibuprofen (ADVIL) 600 MG tablet Take 1 tablet (600 mg total) by mouth every 6 (six) hours as needed. 30 tablet Lucion Dilger, Myrene Galas, MD     PDMP not reviewed this encounter.   Parke Jandreau, Myrene Galas, MD  08/18/19 2220  

## 2019-11-30 ENCOUNTER — Other Ambulatory Visit: Payer: Self-pay

## 2019-11-30 ENCOUNTER — Ambulatory Visit (HOSPITAL_COMMUNITY)
Admission: EM | Admit: 2019-11-30 | Discharge: 2019-11-30 | Disposition: A | Discharge status: Home / Self Care | Attending: Family Medicine | Admitting: Family Medicine

## 2019-11-30 DIAGNOSIS — U071 COVID-19: Secondary | ICD-10-CM | POA: Diagnosis not present

## 2019-11-30 DIAGNOSIS — Z20822 Contact with and (suspected) exposure to covid-19: Secondary | ICD-10-CM | POA: Diagnosis present

## 2019-11-30 NOTE — ED Triage Notes (Signed)
Pt c/o fever, runny nose, body aches, sore throat and fatigue since Friday. Taking Nyquil at home but not helping. Unsure of COVID exposure

## 2019-11-30 NOTE — ED Provider Notes (Signed)
[place]    CSN: [id number] Arrival date & time: 11/30/19  1718      History   Chief Complaint Chief Complaint  Patient presents with  . Covid Exposure    HPI Guy Sutton is a 24 y.o. male.   He is presenting with fever, malaise and chills.  He will he goes to work and home.  Denies exposure anyone similar symptoms.  They seem to be staying the same.  HPI  No past medical history on file.  Patient Active Problem List   Diagnosis Date Noted  . 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 Sepsis ([place]) 12/25/2015    No past surgical history on file.     Home Medications    Prior to Admission medications   Not on File    Family History No family history on file.  Social History Social History   Tobacco Use  . Smoking status: Never Smoker  . Smokeless tobacco: Never Used  Substance Use Topics  . Alcohol use: Not Currently  . Drug use: No     Allergies   Patient has no known allergies.   Review of Systems Review of Systems  See HPI  Physical Exam Triage Vital Signs ED Triage Vitals [11/30/19 1751]  Enc Vitals Group     BP 127/88     Pulse Rate 91     Resp 16     Temp 99.6 F (37.6 C)     Temp src      SpO2 99 %     Weight      Height      Head Circumference      Peak Flow      Pain Score 0     Pain Loc      Pain Edu?      Excl. in [place]?    No data found.  Updated Vital Signs BP 127/88   Pulse 91   Temp 99.6 F (37.6 C)   Resp 16   SpO2 99%   Visual Acuity Right Eye Distance:   Left Eye Distance:   Bilateral Distance:    Right Eye Near:   Left Eye Near:    Bilateral Near:     Physical Exam Gen: NAD, alert, cooperative with exam,  ENT: normal lips, normal nasal mucosa, tympanic membranes clear and intact, no tonsillar exudates Eye: normal EOM, normal conjunctiva and lids CV:  no edema, +2 pedal pulses   Resp: no accessory muscle use, non-labored,  Skin: no rashes, no areas of induration     UC  Treatments / Results  Labs (all labs ordered are listed, but only abnormal results are displayed) Labs Reviewed  SARS CORONAVIRUS 2 (TAT 6-24 HRS)    EKG   Radiology No results found.  Procedures Procedures (including critical care time)  Medications Ordered in UC Medications - No data to display  Initial Impression / Assessment and Plan / UC Course  I have reviewed the triage vital signs and the nursing notes.  Pertinent labs & imaging results that were available during my care of the patient were reviewed by me and considered in my medical decision making (see chart for details).     Mr. Guy Sutton is a 24 year old male is presenting with possible Covid infection.  Swab was obtained.  Provided work note.  Counseled on supportive care.  Given occasions follow-up return.  Final Clinical Impressions(s) / UC Diagnoses   Final diagnoses:  Suspected COVID-19 virus infection  Discharge Instructions     Please stay well hydrated  Please alternate ibuprofen and tylenol  Please follow up if your symptoms fail to improve.     ED Prescriptions    None     PDMP not reviewed this encounter.   Myra Rude, MD 11/30/19 2059

## 2019-11-30 NOTE — Discharge Instructions (Signed)
Please stay well hydrated  Please alternate ibuprofen and tylenol  Please follow up if your symptoms fail to improve.

## 2019-12-01 ENCOUNTER — Telehealth (HOSPITAL_COMMUNITY): Payer: Self-pay

## 2019-12-01 LAB — SARS CORONAVIRUS 2 (TAT 6-24 HRS): SARS Coronavirus 2: POSITIVE — AB

## 2019-12-02 ENCOUNTER — Telehealth: Payer: Self-pay | Admitting: Unknown Physician Specialty

## 2019-12-02 NOTE — Telephone Encounter (Signed)
Called to discuss with patient about Covid symptoms and the use of bamlanivimab, a monoclonal antibody infusion for those with mild to moderate Covid symptoms and at a high risk of hospitalization.  Pt is may be qualified for this infusion at the [facility] infusion center   Message left to call back

## 2020-02-12 ENCOUNTER — Ambulatory Visit (HOSPITAL_COMMUNITY)
Admission: EM | Admit: 2020-02-12 | Discharge: 2020-02-12 | Disposition: A | Discharge status: Home / Self Care | Attending: Family Medicine | Admitting: Family Medicine

## 2020-02-12 ENCOUNTER — Encounter (HOSPITAL_COMMUNITY): Payer: Self-pay | Admitting: Emergency Medicine

## 2020-02-12 DIAGNOSIS — R21 Rash and other nonspecific skin eruption: Secondary | ICD-10-CM | POA: Insufficient documentation

## 2020-02-12 MED ORDER — HYDROCORTISONE 1 % EX CREA: TOPICAL_CREAM | CUTANEOUS | 0 refills | Status: DC

## 2020-02-12 MED ORDER — FLUCONAZOLE 150 MG PO TABS: ORAL_TABLET | ORAL | 0 refills | Status: DC

## 2020-02-12 MED ORDER — MICONAZOLE NITRATE 2 % EX CREA: 1.0000 "application " | TOPICAL_CREAM | Freq: Two times a day (BID) | CUTANEOUS | 0 refills | Status: DC

## 2020-02-12 NOTE — Discharge Instructions (Signed)
Stop applying peroxide or alcohol.  Gentle soap and water.  Take 1 pill provided, apply a mixture of the two creams provided, twice a day.  Don't wrap in gauze.  Follow up in a week if no improvement.  We will notify of you any positive findings from your testing or if any changes to treatment are needed. If normal or otherwise without concern to your results, we will not call you. Please log on to your MyChart to review your results if interested in so.

## 2020-02-12 NOTE — ED Triage Notes (Addendum)
Pt c/o penis itching and discoloration onset Monday. Pt states he has been using A&D ointment and cleaning it. Pt denies trouble with urination. Pt states he has not had penile discharge but the actual skin is dry and flaky. Pt states wife had a yeast infection last month.

## 2020-02-13 NOTE — ED Provider Notes (Signed)
[facility]    CSN: [id number] Arrival date & time: 02/12/20  1648      History   Chief Complaint Chief Complaint  Patient presents with  . Penis Pain    HPI Guy Sutton is a 24 y.o. male.   Guy Sutton presents with complaints of rash/lesions to penis which are somewhat tender as well as itch. He has been applying a&d ointment and wrapping it in gauze, states his wife has also been cleansing it with peroxide as well as alcohol. Denies any previous similar. Denies any penile discharge. This has been ongoing for a few days now. Prior to onset he had been masturbating without any lubricant, endorsing feeling like this may have contributed to skin breakdown.   ROS per HPI, negative if not otherwise mentioned.      History reviewed. No pertinent past medical history.  Patient Active Problem List   Diagnosis Date Noted  . 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 Sepsis ([facility]) 12/25/2015    History reviewed. No pertinent surgical history.     Home Medications    Prior to Admission medications   Medication Sig Start Date End Date Taking? Authorizing Provider  fluconazole (DIFLUCAN) 150 MG tablet Take 1 tablet today. If still with symptoms may repeat in 1 02/12/20   Linus Mako B, NP  hydrocortisone cream 1 % Apply to affected area 2 times daily 02/12/20   Linus Mako B, NP  miconazole (MICOTIN) 2 % cream Apply 1 application topically 2 (two) times daily. 02/12/20   Georgetta Haber, NP    Family History Family History  Problem Relation Age of Onset  . Healthy Mother   . Healthy Father     Social History Social History   Tobacco Use  . Smoking status: Never Smoker  . Smokeless tobacco: Never Used  Vaping Use  . Vaping Use: Never used  Substance Use Topics  . Alcohol use: Not Currently  . Drug use: No     Allergies   Patient has no known allergies.   Review of Systems Review of Systems   Physical Exam Triage Vital Signs ED  Triage Vitals  Enc Vitals Group     BP 02/12/20 1854 (!) 131/79     Pulse Rate 02/12/20 1854 80     Resp 02/12/20 1854 18     Temp 02/12/20 1854 98.6 F (37 C)     Temp Source 02/12/20 1854 Oral     SpO2 02/12/20 1854 100 %     Weight --      Height --      Head Circumference --      Peak Flow --      Pain Score 02/12/20 1851 0     Pain Loc --      Pain Edu? --      Excl. in [place]? --    No data found.  Updated Vital Signs BP (!) 131/79 (BP Location: Left Arm)   Pulse 80   Temp 98.6 F (37 C) (Oral)   Resp 18   SpO2 100%   Visual Acuity Right Eye Distance:   Left Eye Distance:   Bilateral Distance:    Right Eye Near:   Left Eye Near:    Bilateral Near:     Physical Exam Exam conducted with a chaperone present.  Constitutional:      Appearance: He is well-developed.  Cardiovascular:     Rate and Rhythm: Normal rate.  Pulmonary:  Effort: Pulmonary effort is normal.  Genitourinary:    Penis: Circumcised.      Comments: Small amount of clear/white penile discharge noted; posterior aspect of distal shaft, just proximal to glans penis, with scaly and dry raised cluster of lesions as well as fissures surrounding; no active drainage or pustules, no scabs; no redness or swelling Skin:    General: Skin is warm and dry.  Neurological:     Mental Status: He is alert and oriented to person, place, and time.      UC Treatments / Results  Labs (all labs ordered are listed, but only abnormal results are displayed) Labs Reviewed  HSV CULTURE AND TYPING  CYTOLOGY, (ORAL, ANAL, URETHRAL) ANCILLARY ONLY    EKG   Radiology No results found.  Procedures Procedures (including critical care time)  Medications Ordered in UC Medications - No data to display  Initial Impression / Assessment and Plan / UC Course  I have reviewed the triage vital signs and the nursing notes.  Pertinent labs & imaging results that were available during my care of the patient were  reviewed by me and considered in my medical decision making (see chart for details).     Herpes vs fungal vs bacterial vs irritant dermatitis discussed and considered. Encouraged to stop alcohol and hydrogen peroxide application. Penile cytology and hsv testing collected. Balanitis treatment provided. Return precautions provided. Patient verbalized understanding and agreeable to plan.   Final Clinical Impressions(s) / UC Diagnoses   Final diagnoses:  Penile rash     Discharge Instructions     Stop applying peroxide or alcohol.  Gentle soap and water.  Take 1 pill provided, apply a mixture of the two creams provided, twice a day.  Don't wrap in gauze.  Follow up in a week if no improvement.  We will notify of you any positive findings from your testing or if any changes to treatment are needed. If normal or otherwise without concern to your results, we will not call you. Please log on to your MyChart to review your results if interested in so.     ED Prescriptions    Medication Sig Dispense Auth. Provider   fluconazole (DIFLUCAN) 150 MG tablet Take 1 tablet today. If still with symptoms may repeat in 1 2 tablet Arrington Bencomo B, NP   hydrocortisone cream 1 % Apply to affected area 2 times daily 15 g Nellie Pester B, NP   miconazole (MICOTIN) 2 % cream Apply 1 application topically 2 (two) times daily. 28.35 g Georgetta Haber, NP     PDMP not reviewed this encounter.   Georgetta Haber, NP 02/13/20 2228

## 2020-02-15 LAB — CYTOLOGY, (ORAL, ANAL, URETHRAL) ANCILLARY ONLY
Chlamydia: NEGATIVE
Comment: NEGATIVE
Comment: NEGATIVE
Comment: NORMAL
Neisseria Gonorrhea: NEGATIVE
Trichomonas: NEGATIVE

## 2020-02-16 LAB — HSV CULTURE AND TYPING

## 2020-06-14 ENCOUNTER — Encounter (HOSPITAL_COMMUNITY): Payer: Self-pay | Admitting: Emergency Medicine

## 2020-06-14 ENCOUNTER — Other Ambulatory Visit: Payer: Self-pay

## 2020-06-14 ENCOUNTER — Emergency Department (HOSPITAL_COMMUNITY)
Admission: EM | Admit: 2020-06-14 | Discharge: 2020-06-14 | Disposition: A | Discharge status: Home / Self Care | Attending: Emergency Medicine | Admitting: Emergency Medicine

## 2020-06-14 DIAGNOSIS — H9202 Otalgia, left ear: Secondary | ICD-10-CM | POA: Diagnosis present

## 2020-06-14 DIAGNOSIS — H60392 Other infective otitis externa, left ear: Secondary | ICD-10-CM

## 2020-06-14 MED ORDER — CIPROFLOXACIN-DEXAMETHASONE 0.3-0.1 % OT SUSP: 4.0000 [drp] | Freq: Two times a day (BID) | OTIC | 0 refills | Status: AC

## 2020-06-14 NOTE — ED Triage Notes (Signed)
Patient here from home reporting left ear pain x3 days. OTC ear drops with no relief.

## 2020-06-14 NOTE — Discharge Instructions (Signed)
  Apply the eardrops into the affected ear, 4 drops twice daily for 7 days.  Be sure to carefully follow the attached instructions for best use of the eardrops.  Antiinflammatory medications: Take 600 mg of ibuprofen every 6 hours or 440 mg (over the counter dose) to 500 mg (prescription dose) of naproxen every 12 hours for the next 3 days. After this time, these medications may be used as needed for pain. Take these medications with food to avoid upset stomach. Choose only one of these medications, do not take them together. Acetaminophen (generic for Tylenol): Should you continue to have additional pain while taking the ibuprofen or naproxen, you may add in acetaminophen as needed. Your daily total maximum amount of acetaminophen from all sources should be limited to 4000mg /day for persons without liver problems, or 2000mg /day for those with liver problems.  Sanitize any previously used objects that will go in or near the ear. Dry the ears promptly after bathing.  Avoid submerging the ear for at least the next 2 weeks.  Should symptoms fail to resolve, follow-up with a primary care provider or ear nose and throat (ENT) specialist.

## 2020-06-14 NOTE — ED Provider Notes (Signed)
[facility]-EMERGENCY DEPT Provider Note   CSN: [id number] Arrival date & time: 06/14/20  2040     History Chief Complaint  Patient presents with  . Otalgia    Guy Sutton is a 24 y.o. male.  HPI      Guy Sutton is a 24 y.o. male, patient with no pertinent past medical history, presenting to the ED with left ear pain for the last 2 to 3 days.  Pain is throbbing/aching, moderate, nonradiating. Accompanied by dampened hearing. He does state he frequently uses in the ear headphones Denies fever, ear drainage, sore throat, cough, dizziness, or any other complaints.      History reviewed. No pertinent past medical history.  Patient Active Problem List   Diagnosis Date Noted  . 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 Sepsis ([facility]) 12/25/2015    History reviewed. No pertinent surgical history.     Family History  Problem Relation Age of Onset  . Healthy Mother   . Healthy Father     Social History   Tobacco Use  . Smoking status: Never Smoker  . Smokeless tobacco: Never Used  Vaping Use  . Vaping Use: Never used  Substance Use Topics  . Alcohol use: Not Currently  . Drug use: No    Home Medications Prior to Admission medications   Medication Sig Start Date End Date Taking? Authorizing Provider  ciprofloxacin-dexamethasone (CIPRODEX) OTIC suspension Place 4 drops into the left ear 2 (two) times daily for 7 days. 06/14/20 06/21/20  Falecia Vannatter C, PA-C  fluconazole (DIFLUCAN) 150 MG tablet Take 1 tablet today. If still with symptoms may repeat in 1 02/12/20   Linus Mako B, NP  hydrocortisone cream 1 % Apply to affected area 2 times daily 02/12/20   Linus Mako B, NP  miconazole (MICOTIN) 2 % cream Apply 1 application topically 2 (two) times daily. 02/12/20   Georgetta Haber, NP    Allergies    Patient has no known allergies.  Review of Systems   Review of Systems  Constitutional: Negative for fever.  HENT: Positive for  ear pain. Negative for ear discharge, facial swelling, sore throat and trouble swallowing.   Gastrointestinal: Negative for nausea and vomiting.  Musculoskeletal: Negative for neck pain.  Neurological: Negative for dizziness and headaches.    Physical Exam Updated Vital Signs BP (!) 168/96 (BP Location: Right Arm)   Pulse 93   Temp 99.3 F (37.4 C) (Oral)   Resp 18   SpO2 100%   Physical Exam Vitals and nursing note reviewed.  Constitutional:      General: He is not in acute distress.    Appearance: He is well-developed. He is not diaphoretic.  HENT:     Head: Normocephalic and atraumatic.     Right Ear: Tympanic membrane, ear canal and external ear normal.     Ears:     Comments: Edema and erythema to the left ear canal partially occluding the TM.  Visible TM unremarkable.  No noted fluid in the ear canal. No erythema, tenderness, swelling to the left external ear.  No tenderness or swelling to the left tragus, mandible, scalp, or mastoid process.    Mouth/Throat:     Mouth: Mucous membranes are moist.  Eyes:     Conjunctiva/sclera: Conjunctivae normal.  Cardiovascular:     Rate and Rhythm: Normal rate and regular rhythm.  Pulmonary:     Effort: Pulmonary effort is normal.  Musculoskeletal:  Cervical back: Neck supple.  Skin:    General: Skin is warm and dry.     Coloration: Skin is not pale.  Neurological:     Mental Status: He is alert.  Psychiatric:        Behavior: Behavior normal.     ED Results / Procedures / Treatments   Labs (all labs ordered are listed, but only abnormal results are displayed) Labs Reviewed - No data to display  EKG None  Radiology No results found.  Procedures Procedures (including critical care time)  Medications Ordered in ED Medications - No data to display  ED Course  I have reviewed the triage vital signs and the nursing notes.  Pertinent labs & imaging results that were available during my care of the patient were  reviewed by me and considered in my medical decision making (see chart for details).    MDM Rules/Calculators/A&P                          Patient presents with 2 to 3 days of left ear pain and dampened hearing.  He has evidence of otitis externa on exam with swelling partially occluding the TM. Since the TM was not able to be fully visualized, I chose an antibiotic agent that would still be appropriate if there was an area of perforation to the TM.    The patient was given instructions for home care as well as return precautions. Patient voices understanding of these instructions, accepts the plan, and is comfortable with discharge.    Final Clinical Impression(s) / ED Diagnoses Final diagnoses:  Other infective acute otitis externa of left ear    Rx / DC Orders ED Discharge Orders         Ordered    ciprofloxacin-dexamethasone (CIPRODEX) OTIC suspension  2 times daily        06/14/20 2120           Concepcion Living 06/14/20 2134    Terrilee Files, MD 06/15/20 [phone]

## 2020-06-15 ENCOUNTER — Emergency Department (HOSPITAL_COMMUNITY)
Admission: EM | Admit: 2020-06-15 | Discharge: 2020-06-15 | Disposition: A | Discharge status: Home / Self Care | Attending: Emergency Medicine | Admitting: Emergency Medicine

## 2020-06-15 ENCOUNTER — Encounter (HOSPITAL_COMMUNITY): Payer: Self-pay

## 2020-06-15 DIAGNOSIS — R590 Localized enlarged lymph nodes: Secondary | ICD-10-CM | POA: Diagnosis not present

## 2020-06-15 DIAGNOSIS — Z20822 Contact with and (suspected) exposure to covid-19: Secondary | ICD-10-CM | POA: Diagnosis not present

## 2020-06-15 DIAGNOSIS — R591 Generalized enlarged lymph nodes: Secondary | ICD-10-CM

## 2020-06-15 DIAGNOSIS — H60332 Swimmer's ear, left ear: Secondary | ICD-10-CM | POA: Insufficient documentation

## 2020-06-15 DIAGNOSIS — H9201 Otalgia, right ear: Secondary | ICD-10-CM | POA: Diagnosis present

## 2020-06-15 LAB — GROUP A STREP BY PCR: Group A Strep by PCR: NOT DETECTED

## 2020-06-15 LAB — RESP PANEL BY RT-PCR (FLU A&B, COVID) ARPGX2
Influenza A by PCR: NEGATIVE
Influenza B by PCR: NEGATIVE
SARS Coronavirus 2 by RT PCR: NEGATIVE

## 2020-06-15 NOTE — ED Provider Notes (Signed)
[facility]-EMERGENCY DEPT Provider Note   CSN: [id number] Arrival date & time: 06/15/20  1227     History Chief Complaint  Patient presents with  . Sore Throat    Guy Sutton is a 24 y.o. male.  Patient presents to the emergency department for evaluation of left-sided submandibular swelling.  Patient was seen in the emergency department yesterday for evaluation of left ear pain.  Patient was diagnosed with otitis externa.  He was given a prescription for Ciprodex eardrops which he has not yet filled.  In the past, he has had swelling in the area of this lymph node.  When this happens he has been placed on clindamycin.  He wanted to make sure that he did not need oral antibiotics.  No fevers, nausea or vomiting.  No other new complaints.        History reviewed. No pertinent past medical history.  Patient Active Problem List   Diagnosis Date Noted  . 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 Sepsis ([facility]) 12/25/2015    History reviewed. No pertinent surgical history.     Family History  Problem Relation Age of Onset  . Healthy Mother   . Healthy Father     Social History   Tobacco Use  . Smoking status: Never Smoker  . Smokeless tobacco: Never Used  Vaping Use  . Vaping Use: Never used  Substance Use Topics  . Alcohol use: Not Currently  . Drug use: No    Home Medications Prior to Admission medications   Medication Sig Start Date End Date Taking? Authorizing Provider  ciprofloxacin-dexamethasone (CIPRODEX) OTIC suspension Place 4 drops into the left ear 2 (two) times daily for 7 days. 06/14/20 06/21/20  Joy, Shawn C, PA-C  fluconazole (DIFLUCAN) 150 MG tablet Take 1 tablet today. If still with symptoms may repeat in 1 02/12/20   Linus Mako B, NP  hydrocortisone cream 1 % Apply to affected area 2 times daily 02/12/20   Linus Mako B, NP  miconazole (MICOTIN) 2 % cream Apply 1 application topically 2 (two) times daily. 02/12/20    Georgetta Haber, NP    Allergies    Patient has no known allergies.  Review of Systems   Review of Systems  Constitutional: Negative for chills and fever.  HENT: Positive for ear pain and facial swelling. Negative for congestion, ear discharge, rhinorrhea, sinus pressure and sore throat.   Eyes: Negative for redness.  Respiratory: Negative for cough.   Skin: Negative for color change.  Neurological: Negative for headaches.  Hematological: Positive for adenopathy.    Physical Exam Updated Vital Signs BP (!) 149/93 (BP Location: Right Arm)   Pulse 88   Temp 98.5 F (36.9 C) (Oral)   Resp 16   SpO2 100%   Physical Exam Vitals and nursing note reviewed.  Constitutional:      Appearance: He is well-developed.  HENT:     Head: Normocephalic and atraumatic.     Right Ear: Tympanic membrane and ear canal normal.     Left Ear: Drainage present.  No middle ear effusion. Tympanic membrane is not injected.     Ears:     Comments: Pain on left with tugging of pinna.  Canal swollen. Eyes:     Conjunctiva/sclera: Conjunctivae normal.  Pulmonary:     Effort: No respiratory distress.  Musculoskeletal:     Cervical back: Normal range of motion and neck supple.  Skin:    General: Skin is warm and  dry.  Neurological:     Mental Status: He is alert.     ED Results / Procedures / Treatments   Labs (all labs ordered are listed, but only abnormal results are displayed) Labs Reviewed  GROUP A STREP BY PCR  RESP PANEL BY RT-PCR (FLU A&B, COVID) [id number]    EKG None  Radiology No results found.  Procedures Procedures (including critical care time)  Medications Ordered in ED Medications - No data to display  ED Course  I have reviewed the triage vital signs and the nursing notes.  Pertinent labs & imaging results that were available during my care of the patient were reviewed by me and considered in my medical decision making (see chart for details).  Patient seen and  examined.  Patient encouraged to fill eardrops and take as prescribed.  Counseled on use of NSAIDs for any pain.  The lymph node that is swollen does not appear overtly infected.  Possibly reactive.  I would not treat unless symptoms worsen or if symptoms did not improve with resolution of otitis externa.  Vital signs reviewed and are as follows: BP (!) 149/93 (BP Location: Right Arm)   Pulse 88   Temp 98.5 F (36.9 C) (Oral)   Resp 16   SpO2 100%     MDM Rules/Calculators/A&P                           Acute otitis externa: Patient encouraged to fill antibiotics which I feel are appropriate at this time  Lymphadenopathy: Monitor, possibly related to underlying infections.  No dental infection at this point.  Do not feel the patient requires oral antibiotics acutely.   Final Clinical Impression(s) / ED Diagnoses Final diagnoses:  Acute swimmer's ear of left side  Lymphadenopathy    Rx / DC Orders ED Discharge Orders    None       Renne Crigler, PA-C 06/15/20 1414    Tegeler, Canary Brim, MD 06/15/20 [phone]

## 2020-06-15 NOTE — Discharge Instructions (Signed)
Please fill and use eardrops as instructed.  Return with worsening symptoms, fever, worsening swelling or difficulty swallowing.

## 2020-06-15 NOTE — ED Triage Notes (Signed)
Pt arrived via walk in, c/o sore throat. Was seen yesterday for ear pain, dx with ear infection. Has not started abx yet.

## 2020-12-08 ENCOUNTER — Encounter: Payer: Self-pay | Admitting: *Deleted

## 2021-01-12 ENCOUNTER — Encounter (HOSPITAL_COMMUNITY): Payer: Self-pay

## 2021-01-12 ENCOUNTER — Emergency Department (HOSPITAL_COMMUNITY): Payer: Self-pay

## 2021-01-12 ENCOUNTER — Other Ambulatory Visit: Payer: Self-pay

## 2021-01-12 ENCOUNTER — Emergency Department (HOSPITAL_COMMUNITY)
Admission: EM | Admit: 2021-01-12 | Discharge: 2021-01-12 | Disposition: A | Discharge status: Home / Self Care | Payer: Self-pay | Attending: Emergency Medicine | Admitting: Emergency Medicine

## 2021-01-12 DIAGNOSIS — Y9367 Activity, basketball: Secondary | ICD-10-CM | POA: Insufficient documentation

## 2021-01-12 DIAGNOSIS — W231XXA Caught, crushed, jammed, or pinched between stationary objects, initial encounter: Secondary | ICD-10-CM | POA: Insufficient documentation

## 2021-01-12 DIAGNOSIS — S63637A Sprain of interphalangeal joint of left little finger, initial encounter: Secondary | ICD-10-CM | POA: Insufficient documentation

## 2021-01-12 MED ORDER — IBUPROFEN 600 MG PO TABS: 600.0000 mg | ORAL_TABLET | Freq: Four times a day (QID) | ORAL | 0 refills | Status: DC | PRN

## 2021-01-12 NOTE — ED Provider Notes (Signed)
[facility]-EMERGENCY DEPT Provider Note   CSN: [id number] Arrival date & time: 01/12/21  2053     History Chief Complaint  Patient presents with   Finger Injury    Guy Sutton is a 25 y.o. male.  The history is provided by the patient. No language interpreter was used.   25 year old male who presents for evaluation of a finger injury.  Patient reports 2 days ago while playing basketball he accidentally jammed his left pinky finger.  Since then he has noticed persistent pain about the joint of the finger and also noticed that he is having difficulty straightening his finger.  Pain is sharp, moderate in intensity nonradiating without any associated numbness.  He is right-hand dominant.  He has been using a finger splint for protection.  He denies any other injury.  History reviewed. No pertinent past medical history.  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 No pertinent surgical history.     Family History  Problem Relation Age of Onset   Healthy Mother    Healthy Father     Social History   Tobacco Use   Smoking status: Never   Smokeless tobacco: Never  Vaping Use   Vaping Use: Never used  Substance Use Topics   Alcohol use: Not Currently   Drug use: No    Home Medications Prior to Admission medications   Medication Sig Start Date End Date Taking? Authorizing Provider  fluconazole (DIFLUCAN) 150 MG tablet Take 1 tablet today. If still with symptoms may repeat in 1 02/12/20   Linus Mako B, NP  hydrocortisone cream 1 % Apply to affected area 2 times daily 02/12/20   Linus Mako B, NP  miconazole (MICOTIN) 2 % cream Apply 1 application topically 2 (two) times daily. 02/12/20   Georgetta Haber, NP    Allergies    Patient has no known allergies.  Review of Systems   Review of Systems  Constitutional:  Negative for fever.   Musculoskeletal:  Positive for arthralgias.  Neurological:  Negative for numbness.   Physical Exam Updated Vital Signs BP 125/85   Pulse 76   Temp 98.3 F (36.8 C) (Oral)   Resp 14   SpO2 96%   Physical Exam Vitals and nursing note reviewed.  Constitutional:      General: He is not in acute distress.    Appearance: He is well-developed.  HENT:     Head: Atraumatic.  Eyes:     Conjunctiva/sclera: Conjunctivae normal.  Musculoskeletal:        General: Signs of injury (L little finger: tenderness to PIP with swelling noted.  distal digit hyperextended at DIP but able to flex.  sensation intact with brisk cap refill.) present.     Cervical back: Neck supple.  Skin:    Findings: No rash.  Neurological:     Mental Status: He is alert.    ED Results / Procedures / Treatments   Labs (all labs ordered are listed, but only abnormal results are displayed) Labs Reviewed - No data to display  EKG None  Radiology No results found.  Procedures Procedures   Medications Ordered in ED Medications - No data to display  ED Course  I have reviewed the triage vital signs and the nursing notes.  Pertinent labs & imaging results that were available during my care of the patient were reviewed by  me and considered in my medical decision making (see chart for details).    MDM Rules/Calculators/A&P                          BP 125/85   Pulse 76   Temp 98.3 F (36.8 C) (Oral)   Resp 14   SpO2 96%   Final Clinical Impression(s) / ED Diagnoses Final diagnoses:  Sprain of interphalangeal joint of left little finger, initial encounter    Rx / DC Orders ED Discharge Orders          Ordered    ibuprofen (ADVIL) 600 MG tablet  Every 6 hours PRN        01/12/21 2154           9:50 PM Pt jammed his L little finger while playing basketball.  Xray shows soft tissue swelling without fx or dislocation.  However finger is hyperextended at the DIP suggestive of potential flexor  tendon injury.  Recommend continue wearing finger splint that he has available.  Referral to hand specialist given.     Fayrene Helper, PA-C 01/12/21 2157    Rozelle Logan, DO 01/12/21 2222

## 2021-01-12 NOTE — ED Notes (Signed)
An After Visit Summary was printed and given to the patient. Discharge instructions given and no further questions at this time.  

## 2021-01-12 NOTE — ED Triage Notes (Signed)
Pt states he was playing ball yesterday and accidentally jammed his finger against the ball. Pt c/o pain to his left pinky finger.

## 2021-01-12 NOTE — Discharge Instructions (Addendum)
You have been diagnosed with a finger sprain.  There is a potential for tendon injury causing difficulty bending your finger.  Wear finger splint at all time.  It usually takes about 6 weeks for the tendon to heal.  Follow-up with hand specialist as needed.  Return if you have any concern.

## 2021-01-25 ENCOUNTER — Encounter: Payer: Self-pay | Admitting: Plastic Surgery

## 2021-01-25 ENCOUNTER — Ambulatory Visit (INDEPENDENT_AMBULATORY_CARE_PROVIDER_SITE_OTHER): Payer: Self-pay | Admitting: Plastic Surgery

## 2021-01-25 ENCOUNTER — Other Ambulatory Visit: Payer: Self-pay

## 2021-01-25 VITALS — BP 118/80 | HR 67 | Ht 74.0 in | Wt 201.0 lb

## 2021-01-25 DIAGNOSIS — M20022 Boutonniere deformity of left finger(s): Secondary | ICD-10-CM

## 2021-01-25 NOTE — Progress Notes (Signed)
   Referring Provider No referring provider defined for this encounter.   CC:  Chief Complaint  Patient presents with   consult      Guy Sutton is an 25 y.o. male.  HPI: Patient presents after jamming injury to his left small finger while playing basketball.  He said the finger swelled around the PIP joint and assumed a boutonniere type posture.  He was seen in the emergency room and no fracture was identified.  He was splinted with a finger in full extension and sent here for follow-up.  No Known Allergies  Outpatient Encounter Medications as of 01/25/2021  Medication Sig   fluconazole (DIFLUCAN) 150 MG tablet Take 1 tablet today. If still with symptoms may repeat in 1 (Patient not taking: Reported on 01/25/2021)   hydrocortisone cream 1 % Apply to affected area 2 times daily (Patient not taking: Reported on 01/25/2021)   ibuprofen (ADVIL) 600 MG tablet Take 1 tablet (600 mg total) by mouth every 6 (six) hours as needed for moderate pain. (Patient not taking: Reported on 01/25/2021)   miconazole (MICOTIN) 2 % cream Apply 1 application topically 2 (two) times daily. (Patient not taking: Reported on 01/25/2021)   No facility-administered encounter medications on file as of 01/25/2021.     No past medical history on file.  No past surgical history on file.  Family History  Problem Relation Age of Onset   Healthy Mother    Healthy Father     Social History   Social History Narrative   Not on file     Review of Systems General: Denies fevers, chills, weight loss CV: Denies chest pain, shortness of breath, palpitations  Physical Exam Vitals with BMI 01/25/2021 01/12/2021 06/15/2020  Height 6\' 2"  - -  Weight 201 lbs - -  BMI 25.8 - -  Systolic 118 125  Diastolic 80 85 97  Pulse 67 76 70    General:  No acute distress,  Alert and oriented, Non-Toxic, Normal speech and affect Left hand: Fingers well-perfused with normal capillary refill to palp radial pulse.  Sensation is  intact throughout.  He has intact flexion and extension.  The left small finger is in a mild boutonniere posture which she says it is is an improvement since it has been splinted.  He can flex and extend the DIP joint.  He cannot fully extend at the PIP joint.  His x-ray was reviewed and interpreted by me.  No fracture or dislocation.  There does look to be some boutonniere type posturing of the left small finger on x-ray.  Assessment/Plan Patient presents with an acute closed boutonniere injury.  I recommended splinting the PIP joint in full extension for 6 weeks while leaving the DIP joint and MP joints free.  We will send him to the hand therapist to help with this.  I will plan to see him back around that time at which point he can discontinue the splint and may likely need some therapy to help regain PIP flexion.  All of his questions were answered plan to see him back at that time.  782 01/25/2021, 9:09 AM

## 2021-02-01 ENCOUNTER — Other Ambulatory Visit: Payer: Self-pay

## 2021-02-01 ENCOUNTER — Ambulatory Visit: Payer: Self-pay | Attending: Plastic Surgery | Admitting: Occupational Therapy

## 2021-02-01 ENCOUNTER — Encounter: Payer: Self-pay | Admitting: *Deleted

## 2021-02-01 DIAGNOSIS — M79645 Pain in left finger(s): Secondary | ICD-10-CM | POA: Insufficient documentation

## 2021-02-01 DIAGNOSIS — M6281 Muscle weakness (generalized): Secondary | ICD-10-CM | POA: Insufficient documentation

## 2021-02-01 DIAGNOSIS — M25642 Stiffness of left hand, not elsewhere classified: Secondary | ICD-10-CM | POA: Insufficient documentation

## 2021-02-01 DIAGNOSIS — R278 Other lack of coordination: Secondary | ICD-10-CM | POA: Insufficient documentation

## 2021-02-01 DIAGNOSIS — R601 Generalized edema: Secondary | ICD-10-CM | POA: Insufficient documentation

## 2021-02-01 NOTE — Therapy (Signed)
[facility] [address] [place], [place], [address] Phone: [phone]   Fax:  [phone]  Occupational Therapy Evaluation  Patient Details  Name: Guy Sutton MRN: [id number] Date of Birth: 10-01-1995 Referring Provider (OT): Dr Arita Miss   Encounter Date: 02/01/2021   OT End of Session - 02/01/21 0912     Visit Number 1    Number of Visits 8    Date for OT Re-Evaluation 03/30/21    Authorization Type Pt is self pay    OT Start Time 0810    OT Stop Time 0900    OT Time Calculation (min) 50 min    Activity Tolerance Patient tolerated treatment well    Behavior During Therapy [facility] for tasks assessed/performed             History reviewed. No pertinent past medical history.  History reviewed. No pertinent surgical history.  There were no vitals filed for this visit.   Subjective Assessment - 02/01/21 0815     Subjective  Pt sustained a left small finger boutonniere deformity when playing basketball about a month ago. He was seen in the ER and splinted, he followed up with Dr Arita Miss and presents today for custom splinting to L small PIPJ with MCP and DIP free for motion.    Pertinent History No significant PMH per pt report.    Patient Stated Goals Get use of finger/hand back.    Currently in Pain? No/denies    Pain Score 0-No pain               [facility] OT Assessment - 02/01/21 0001       Assessment   Medical Diagnosis L small finger boutonniere deformity    Referring Provider (OT) Dr Arita Miss    Onset Date/Surgical Date 01/07/21    Hand Dominance Right    Next MD Visit 03/09/21      Precautions   Precautions Other (comment)   Protective splint   Required Braces or Orthoses Other Brace/Splint   Custom finger extension splint     Balance Screen   Has the patient fallen in the past 6 months No    Has the patient had a decrease in activity level because of a fear of falling?  No    Is the patient reluctant  to leave their home because of a fear of falling?  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 Family able to assist per pt report if he needs assistance, however, he has not required any at this time.      Mobility   Mobility Status Independent      Written Expression   Dominant Hand Right  Vision Assessment   Vision Assessment Vision not tested      Activity Tolerance   Activity Tolerance Endurance does not limit participation in activity      Cognition   Overall Cognitive Status Within Functional Limits for tasks assessed      Observation/Other Assessments   Observations Pt presents in prefabricated finger extension splint as issued in ER for left small finger.      Posture/Postural Control   Posture/Postural Control No significant limitations      Sensation   Light Touch Appears Intact      Coordination   Other FMC impaired secondary to required splinting following boutonniere deformity left small finger      Edema   Edema Min edema noted at PIPJ      ROM / Strength   AROM / PROM / Strength AROM;Strength      AROM   Overall AROM  Unable to assess;Deficits    Overall AROM Comments Unable to assess secondary to splinting      Strength   Overall Strength Deficits;Unable to assess    Overall Strength Comments  Unable to assess secondary to injury      Hand Function   Right Hand Gross Grasp Impaired    Comment Impaired, unable to assess                      OT Treatments/Exercises (OP) - 02/01/21 0001       ADLs   ADL Comments Pt was educated verbally, written and via handouts in splinting use, care and precautions. He verbalized understanding and acknowledged wrapping hand/splint when washing/bathing and keeping hand/finger/splint dry.      Splinting   Splinting Pt was fitted with a cuatom fabricated left small finger following boutonniere deformity on January 07, 2021. Pt splint places his left small finger PIPJ in extension with MCP and DIP free for active ROM as per Dr Thomos Lemons orders. He was educated in splinting use, care and precautions and will keep finger/hand/splint dry at all times - ADL's were reviewed in clinic.                   OT Education - 02/01/21 0911     Education Details L small finger protective splinting use, care and porecautions.    Person(s) Educated Patient    Methods Explanation;Demonstration;Handout    Comprehension Verbalized understanding;Need further instruction            WEARING SCHEDULE: Wear splint at ALL times except for hygiene care.   PURPOSE: To prevent movement and for protection until injury can heal   CARE OF SPLINT: Keep splint away from heat sources including: stove, radiator or furnace, or a car in sunlight. The splint can melt and will no longer fit you properly   Keep away from pets and children   Clean the splint with rubbing alcohol as needed. * During this time, make sure you also clean your hand/arm as instructed by your therapist and/or perform dressing changes as needed. Then dry hand/arm completely before replacing splint. (When cleaning hand/arm, keep it immobilized in same position until splint is replaced)   PRECAUTIONS/POTENTIAL PROBLEMS: *If you notice or experience increased pain, swelling, numbness,  or a lingering reddened area from the splint: Contact your therapist immediately by calling [phone]. You must wear the splint for protection, but we will get you scheduled for adjustments as quickly as possible. (If only straps or hooks need to be replaced and NO  adjustments to the splint need to be made, just call the office ahead and let them know you are coming in)   If you have any medical concerns or signs of infection, please call your doctor immediately   OT Short Term Goals - 02/01/21 0921       OT SHORT TERM GOAL #1   Title Pt will be Mod I splinting use, care and precautions left small finger    Baseline need vc's    Time 4    Period Weeks    Status New    Target Date 03/01/21      OT SHORT TERM GOAL #2   Title Pt will be Mod I edema control left small finger as seen by independent ability to state 2-3 edema control techniques    Baseline VC's required    Time 4    Period Weeks    Status New    Target Date 03/01/21               OT Long Term Goals - 02/01/21 0923       OT LONG TERM GOAL #1   Title Pt will be I upgraded splinting HEP    Baseline Depend.    Time 8    Period Weeks    Status New    Target Date 03/29/21      OT LONG TERM GOAL #2   Title Pt will be I HEP left small finger    Baseline Depend.    Time 8    Period Weeks    Status New    Target Date 03/29/21      OT LONG TERM GOAL #3   Title Pt will demonstrate active ROM for Left small finger PIP flexion to within 1 cm of DPC (distal palmar crease).    Baseline unable    Time 8    Period Weeks    Status New    Target Date 03/29/21      OT LONG TERM GOAL #4   Title Pt will demonstrate improving functional grip as seen by JAMAR grip of 10# or more.    Baseline unable to assess    Time 8    Period Weeks    Status New    Target Date 03/29/21      OT LONG TERM GOAL #5   Title Pt will demonstrate active PIP extension left small finger of -10* or less    Baseline NT    Time 8     Period Weeks    Status New    Target Date 03/29/21                   Plan - 02/01/21 0913     Clinical Impression Statement Pt is a plesant 25 y/o right hand dominant male whom sustained a left small finger boutonniere deformity on January 07, 2021 while playing basketball with friends. He went to ER about 3 days after this injury and was issued a left small finger extension splint. He has since followed up with Dr Arita Miss whom referred him today for custom splinting placing his left small finger PIP in extension but allowing for MCP and DIP flexion per Dr Thomos Lemons written orders today. Pt was educated in splinting use, care and precautions and will benefit from follow up treatment for splint adjustments and progression of home program following this injury as per protocol and Dr Thomos Lemons orders. Pt is in [employer] and has physical fitness  training in September at which time, he is hopeful to have the use of his left small finger and hand again.    OT Occupational Profile and History Problem Focused Assessment - Including review of records relating to presenting problem    Occupational performance deficits (Please refer to evaluation for details): ADL's;IADL's    Body Structure / Function / Physical Skills ADL;Strength;Dexterity;Edema;UE functional use;ROM;IADL;Coordination;Flexibility;Mobility;Decreased knowledge of precautions;FMC    Rehab Potential Good    Clinical Decision Making Limited treatment options, no task modification necessary    Comorbidities Affecting Occupational Performance: None    Modification or Assistance to Complete Evaluation  No modification of tasks or assist necessary to complete eval    OT Frequency 1x / week    OT Duration 8 weeks   May need to adjust as pt is self pay   OT Treatment/Interventions Self-care/ADL training;Fluidtherapy;Splinting;Therapeutic activities;Ultrasound;Therapeutic exercise;Passive range of motion;Cryotherapy;Electrical  Stimulation;Paraffin;Manual Therapy;Patient/family education;Moist Heat    Plan Splint check and adjustments PRN next visit and progress as per boutonniere protocol and Dr Thomos LemonsPace's orders for left non-dominant hand/dmall finger.    Consulted and Agree with Plan of Care Patient             Patient will benefit from skilled therapeutic intervention in order to improve the following deficits and impairments:   Body Structure / Function / Physical Skills: ADL, Strength, Dexterity, Edema, UE functional use, ROM, IADL, Coordination, Flexibility, Mobility, Decreased knowledge of precautions, FMC       Visit Diagnosis: Other lack of coordination - Plan: Ot plan of care cert/re-cert  Muscle weakness (generalized) - Plan: Ot plan of care cert/re-cert  Pain in left finger(s) - Plan: Ot plan of care cert/re-cert  Generalized edema - Plan: Ot plan of care cert/re-cert  Stiffness of left hand, not elsewhere classified - Plan: Ot plan of care cert/re-cert    Problem List Patient Active Problem List   Diagnosis Date Noted   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

## 2021-02-01 NOTE — Patient Instructions (Signed)
WEARING SCHEDULE:  Wear splint at ALL times except for hygiene care (May remove splint for exercises and then immediately place back on ONLY if directed by the therapist)  PURPOSE:  To prevent movement and for protection until injury can heal  CARE OF SPLINT:  Keep splint away from heat sources including: stove, radiator or furnace, or a car in sunlight. The splint can melt and will no longer fit you properly  Keep away from pets and children  Clean the splint with rubbing alcohol as needed.  * During this time, make sure you also clean your hand/arm as instructed by your therapist and/or perform dressing changes as needed. Then dry hand/arm completely before replacing splint. (When cleaning hand/arm, keep it immobilized in same position until splint is replaced)  PRECAUTIONS/POTENTIAL PROBLEMS: *If you notice or experience increased pain, swelling, numbness, or a lingering reddened area from the splint: Contact your therapist immediately by calling [phone]. You must wear the splint for protection, but we will get you scheduled for adjustments as quickly as possible.  (If only straps or hooks need to be replaced and NO adjustments to the splint need to be made, just call the office ahead and let them know you are coming in)  If you have any medical concerns or signs of infection, please call your doctor immediately

## 2021-02-08 ENCOUNTER — Ambulatory Visit: Payer: Self-pay | Admitting: Occupational Therapy

## 2021-02-08 ENCOUNTER — Other Ambulatory Visit: Payer: Self-pay

## 2021-02-08 DIAGNOSIS — M25642 Stiffness of left hand, not elsewhere classified: Secondary | ICD-10-CM

## 2021-02-08 NOTE — Therapy (Signed)
[facility] [address] [place], [place], [address] Phone: [phone]   Fax:  [phone]  Occupational Therapy Treatment  Patient Details  Name: Guy Sutton MRN: [id number] Date of Birth: May 20, 1996 Referring Provider (OT): Dr Arita Miss   Encounter Date: 02/08/2021   OT End of Session - 02/08/21 0828     Visit Number 2    Number of Visits 8    Date for OT Re-Evaluation 03/30/21    Authorization Type Pt is self pay    OT Start Time 0800    OT Stop Time 0825    OT Time Calculation (min) 25 min    Activity Tolerance Patient tolerated treatment well    Behavior During Therapy [facility] for tasks assessed/performed             No past medical history on file.  No past surgical history on file.  There were no vitals filed for this visit.   Subjective Assessment - 02/08/21 0821     Subjective  The splint is sliding and I may need a new one    Pertinent History left small finger boutonniere deformity when playing basketball about a month ago. No significant PMH per pt report.    Patient Stated Goals Get use of finger/hand back.    Currently in Pain? No/denies             Pt reports splint too loose and sliding. Made adjustments to splint for tighter fit and added new hook and different strap for tighter fit.  Pt slightly macerated and discussed allowing more air drying of finger after cleaning finger to help prevent this. Emphasized importance of keeping PIP joint in full extension while doing this and showed him how while air drying finger.  Also provided finger stockinette to help reduce perspiration and maceration.   Pt issued formal HEP for DIP and MP flex/ext while pt in splint to keep PIP joint in extension. Pt instructed to still block PIP joint in ext (w/ splint on) while performing DIP flexion to prevent any slight movement in splint.   Pt very appreciative of clarifications and appears to follow  instructions well.                      OT Education - 02/08/21 8682     Education Details HEP for DIP and MP joint with splint on for PIP joint    Person(s) Educated Patient    Methods Explanation;Demonstration;Handout    Comprehension Verbalized understanding;Returned demonstration              OT Short Term Goals - 02/08/21 0829       OT SHORT TERM GOAL #1   Title Pt will be Mod I splinting use, care and precautions left small finger    Baseline need vc's    Time 4    Period Weeks    Status Achieved    Target Date 03/01/21      OT SHORT TERM GOAL #2   Title Pt will be Mod I edema control left small finger as seen by independent ability to state 2-3 edema control techniques    Baseline VC's required    Time 4    Period Weeks    Status On-going    Target Date 03/01/21               OT Long Term Goals - 02/01/21 0923       OT LONG  TERM GOAL #1   Title Pt will be I upgraded splinting HEP    Baseline Depend.    Time 8    Period Weeks    Status New    Target Date 03/29/21      OT LONG TERM GOAL #2   Title Pt will be I HEP left small finger    Baseline Depend.    Time 8    Period Weeks    Status New    Target Date 03/29/21      OT LONG TERM GOAL #3   Title Pt will demonstrate active ROM for Left small finger PIP flexion to within 1 cm of DPC (distal palmar crease).    Baseline unable    Time 8    Period Weeks    Status New    Target Date 03/29/21      OT LONG TERM GOAL #4   Title Pt will demonstrate improving functional grip as seen by JAMAR grip of 10# or more.    Baseline unable to assess    Time 8    Period Weeks    Status New    Target Date 03/29/21      OT LONG TERM GOAL #5   Title Pt will demonstrate active PIP extension left small finger of -10* or less    Baseline NT    Time 8    Period Weeks    Status New    Target Date 03/29/21                   Plan - 02/08/21 0830     Clinical Impression  Statement Pt returns today with reports of splint sliding. Adjustments made to splint for better fit, and formal HEP issued for non involed joints    OT Occupational Profile and History Problem Focused Assessment - Including review of records relating to presenting problem    Occupational performance deficits (Please refer to evaluation for details): ADL's;IADL's    Body Structure / Function / Physical Skills ADL;Strength;Dexterity;Edema;UE functional use;ROM;IADL;Coordination;Flexibility;Mobility;Decreased knowledge of precautions;FMC    Rehab Potential Good    Clinical Decision Making Limited treatment options, no task modification necessary    Comorbidities Affecting Occupational Performance: None    Modification or Assistance to Complete Evaluation  No modification of tasks or assist necessary to complete eval    OT Frequency 1x / week    OT Duration 8 weeks   May need to adjust as pt is self pay   OT Treatment/Interventions Self-care/ADL training;Fluidtherapy;Splinting;Therapeutic activities;Ultrasound;Therapeutic exercise;Passive range of motion;Cryotherapy;Electrical Stimulation;Paraffin;Manual Therapy;Patient/family education;Moist Heat    Plan splint adjustments prn, may call to cancel next appt on 02/16/21 if everything is going ok. Pt scheduled again for 02/23/21. Pt will need to be scheduled for 1-2 follow up appointments with therapist after MD appointment on 03/09/21.    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 [place], [place], [address] Phone: [phone]   Fax:  [phone]  Name: Guy Sutton MRN: [id number] Date of Birth: [birth year]

## 2021-02-08 NOTE — Patient Instructions (Signed)
DIP Flexion (Active Blocked)    Hold __small____ finger firmly at the middle joint with splint on so that only the tip joint can bend. Hold __5__ seconds. Repeat _10___ times. Do __4-6__ sessions per day.  MP Flexion (Active)    With splint on, bend large knuckles as far as they will go, keeping small joints straight. Repeat _10___ times. Do _4-6___ sessions per day.

## 2021-02-16 ENCOUNTER — Ambulatory Visit: Payer: Self-pay | Admitting: Occupational Therapy

## 2021-02-16 ENCOUNTER — Other Ambulatory Visit: Payer: Self-pay

## 2021-02-16 ENCOUNTER — Encounter (HOSPITAL_COMMUNITY): Payer: Self-pay

## 2021-02-16 ENCOUNTER — Emergency Department (HOSPITAL_COMMUNITY)
Admission: EM | Admit: 2021-02-16 | Discharge: 2021-02-16 | Disposition: A | Discharge status: Home / Self Care | Payer: Self-pay | Attending: Emergency Medicine | Admitting: Emergency Medicine

## 2021-02-16 DIAGNOSIS — R509 Fever, unspecified: Secondary | ICD-10-CM | POA: Insufficient documentation

## 2021-02-16 DIAGNOSIS — M25642 Stiffness of left hand, not elsewhere classified: Secondary | ICD-10-CM

## 2021-02-16 DIAGNOSIS — J029 Acute pharyngitis, unspecified: Secondary | ICD-10-CM | POA: Insufficient documentation

## 2021-02-16 MED ORDER — CLINDAMYCIN HCL 150 MG PO CAPS
300.0000 mg | ORAL_CAPSULE | Freq: Three times a day (TID) | ORAL | 0 refills | Status: DC
Start: 2021-02-16 — End: 2021-08-25

## 2021-02-16 NOTE — Therapy (Signed)
[facility] [address] [place], [place], [address] Phone: [phone]   Fax:  [phone]  Occupational Therapy Treatment  Patient Details  Name: Guy Sutton MRN: [id number] Date of Birth: 09/08/1995 Referring Provider (OT): Dr Arita Miss   Encounter Date: 02/16/2021   OT End of Session - 02/16/21 1422     Visit Number 3    Number of Visits 8    Date for OT Re-Evaluation 03/30/21    Authorization Type Pt is self pay    OT Start Time 1405    OT Stop Time 1425    OT Time Calculation (min) 20 min    Activity Tolerance Patient tolerated treatment well    Behavior During Therapy [facility] for tasks assessed/performed             No past medical history on file.  No past surgical history on file.  There were no vitals filed for this visit.   Subjective Assessment - 02/16/21 1411     Subjective  I may need you to fill out some paperwork for me    Pertinent History left small finger boutonniere deformity when playing basketball about a month ago. No significant PMH per pt report.    Patient Stated Goals Get use of finger/hand back.    Currently in Pain? No/denies             Pt required new hook and strap on finger splint. Pt also provided more compressive finger stockinette for mild edema. Clarified donning/doffing splint taco style vs. Sliding up/down.  Pt had requested therapist fill out paperwork however did not bring to today's session.   Reviewed keeping PIP joint in full extension while cleaning hand and allowing sufficient time to air dry before replacing splint                       OT Short Term Goals - 02/08/21 0829       OT SHORT TERM GOAL #1   Title Pt will be Mod I splinting use, care and precautions left small finger    Baseline need vc's    Time 4    Period Weeks    Status Achieved    Target Date 03/01/21      OT SHORT TERM GOAL #2   Title Pt will be Mod I edema control  left small finger as seen by independent ability to state 2-3 edema control techniques    Baseline VC's required    Time 4    Period Weeks    Status On-going    Target Date 03/01/21               OT Long Term Goals - 02/01/21 0923       OT LONG TERM GOAL #1   Title Pt will be I upgraded splinting HEP    Baseline Depend.    Time 8    Period Weeks    Status New    Target Date 03/29/21      OT LONG TERM GOAL #2   Title Pt will be I HEP left small finger    Baseline Depend.    Time 8    Period Weeks    Status New    Target Date 03/29/21      OT LONG TERM GOAL #3   Title Pt will demonstrate active ROM for Left small finger PIP flexion to within 1 cm of DPC (distal palmar crease).  Baseline unable    Time 8    Period Weeks    Status New    Target Date 03/29/21      OT LONG TERM GOAL #4   Title Pt will demonstrate improving functional grip as seen by JAMAR grip of 10# or more.    Baseline unable to assess    Time 8    Period Weeks    Status New    Target Date 03/29/21      OT LONG TERM GOAL #5   Title Pt will demonstrate active PIP extension left small finger of -10* or less    Baseline NT    Time 8    Period Weeks    Status New    Target Date 03/29/21                   Plan - 02/16/21 1423     Clinical Impression Statement Pt only required minor adjustments to splint today including new hook and strap.    OT Occupational Profile and History Problem Focused Assessment - Including review of records relating to presenting problem    Occupational performance deficits (Please refer to evaluation for details): ADL's;IADL's    Body Structure / Function / Physical Skills ADL;Strength;Dexterity;Edema;UE functional use;ROM;IADL;Coordination;Flexibility;Mobility;Decreased knowledge of precautions;FMC    Rehab Potential Good    Clinical Decision Making Limited treatment options, no task modification necessary    Comorbidities Affecting Occupational  Performance: None    Modification or Assistance to Complete Evaluation  No modification of tasks or assist necessary to complete eval    OT Frequency 1x / week    OT Duration 8 weeks   May need to adjust as pt is self pay   OT Treatment/Interventions Self-care/ADL training;Fluidtherapy;Splinting;Therapeutic activities;Ultrasound;Therapeutic exercise;Passive range of motion;Cryotherapy;Electrical Stimulation;Paraffin;Manual Therapy;Patient/family education;Moist Heat    Plan Pt to return on 02/23/21 only if splinting adjustments needed. Pt then to return after he sees MD on 03/09/21 to hopefully begin PIP motion    Consulted and Agree with Plan of Care Patient             Patient will benefit from skilled therapeutic intervention in order to improve the following deficits and impairments:   Body Structure / Function / Physical Skills: ADL, Strength, Dexterity, Edema, UE functional use, ROM, IADL, Coordination, Flexibility, Mobility, Decreased knowledge of precautions, 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 [place], [place], [address] Phone: [phone]   Fax:  [phone]  Name: Guy Sutton MRN: [id number] Date of Birth: 30-Nov-1995

## 2021-02-16 NOTE — ED Triage Notes (Addendum)
Pt reports Tuesday having sore throat. Reports yesterday night having a fever and vomited 2x. Denies being exposure to covid.

## 2021-02-16 NOTE — ED Provider Notes (Signed)
[facility]-EMERGENCY DEPT Provider Note   CSN: [id number] Arrival date & time: 02/16/21  0915     History Chief Complaint  Patient presents with   Sore Throat   Fever    Guy Sutton is a 25 y.o. male.  The history is provided by the patient and medical records.  Sore Throat  Fever. Guy Sutton is a 25 y.o. male who presents to the Emergency Department complaining of sore throat.  He presents to the ED complaining of sore throat. He reports sore throat that started two days ago. He had associated subjective fever and one episode of emesis yesterday. He does have a history of submandibular gland infection due to salivary stone and had one dose of antibiotics that he took two days ago. He did have some swelling to the submandibular gland, which improved with antibiotics. No cough. No additional symptoms. No known sick contacts.    History reviewed. No pertinent past medical history.  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 No pertinent surgical history.     Family History  Problem Relation Age of Onset   Healthy Mother    Healthy Father     Social History   Tobacco Use   Smoking status: Never   Smokeless tobacco: Never  Vaping Use   Vaping Use: Never used  Substance Use Topics   Alcohol use: Not Currently   Drug use: No    Home Medications Prior to Admission medications   Medication Sig Start Date End Date Taking? Authorizing Provider  clindamycin (CLEOCIN) 150 MG capsule Take 2 capsules (300 mg total) by mouth 3 (three) times daily. 02/16/21  Yes Tilden Fossa, MD  fluconazole (DIFLUCAN) 150 MG tablet Take 1 tablet today. If still with symptoms may repeat in 1 Patient not taking: Reported on 01/25/2021 02/12/20   Linus Mako B, NP  hydrocortisone cream 1 % Apply to affected area 2 times daily Patient not taking: Reported on 01/25/2021  02/12/20   Linus Mako B, NP  ibuprofen (ADVIL) 600 MG tablet Take 1 tablet (600 mg total) by mouth every 6 (six) hours as needed for moderate pain. Patient not taking: Reported on 01/25/2021 01/12/21   Fayrene Helper, PA-C  miconazole (MICOTIN) 2 % cream Apply 1 application topically 2 (two) times daily. Patient not taking: Reported on 01/25/2021 02/12/20   Georgetta Haber, NP    Allergies    Patient has no known allergies.  Review of Systems   Review of Systems  Constitutional:  Positive for fever.  All other systems reviewed and are negative.  Physical Exam Updated Vital Signs BP 136/81 (BP Location: Left Arm)   Pulse 66   Temp 100.2 F (37.9 C) (Oral)   Resp 18   Ht 6\' 2"  (1.88 m)   Wt 90.7 kg   SpO2 100%   BMI 25.68 kg/m   Physical Exam Vitals and nursing note reviewed.  Constitutional:      Appearance: He is well-developed.  HENT:     Head: Normocephalic and atraumatic.     Comments: TMs clear bilaterally. There is moderate erythema and mild soft tissue swelling to the posterior oropharynx as well as mild erythema and edema to the uvula. NoPTA. Cardiovascular:     Rate and Rhythm: Normal rate and regular rhythm.     Heart sounds: No murmur heard. Pulmonary:     Effort:  Pulmonary effort is normal. No respiratory distress.     Breath sounds: Normal breath sounds.  Musculoskeletal:        General: No tenderness.     Cervical back: Neck supple.  Lymphadenopathy:     Cervical: No cervical adenopathy.  Skin:    General: Skin is warm and dry.  Neurological:     Mental Status: He is alert and oriented to person, place, and time.  Psychiatric:        Behavior: Behavior normal.    ED Results / Procedures / Treatments   Labs (all labs ordered are listed, but only abnormal results are displayed) Labs Reviewed - No data to display  EKG None  Radiology No results found.  Procedures Procedures   Medications Ordered in ED Medications - No data to display  ED  Course  I have reviewed the triage vital signs and the nursing notes.  Pertinent labs & imaging results that were available during my care of the patient were reviewed by me and considered in my medical decision making (see chart for details).    MDM Rules/Calculators/A&P                          patient here for evaluation of sore throat. He did have a fever yesterday, has a low-grade temperature on ED presentation. He does have a history of sepsis secondary to sialoadenitis. On examination he is non-toxic appearing. He does have erythema and mild edema in the posterior oropharynx. Current clinical picture is not consistent with RPA, PTA, epiglottitis. No evidence of sialoadenitis.  Given his hx and recent glandular swelling that improved with oral abx prior to ED arrival with treat for bacterial pharyngitis with abx and return precautions. D/w pt covid 19 testing - may consider discontinuing abx if this returns positive.  Pt declines testing in ED - will get as an outpatient.   Final Clinical Impression(s) / ED Diagnoses Final diagnoses:  Acute pharyngitis, unspecified etiology    Rx / DC Orders ED Discharge Orders          Ordered    clindamycin (CLEOCIN) 150 MG capsule  3 times daily        02/16/21 1012             Tilden Fossa, MD 02/16/21 1020

## 2021-02-23 ENCOUNTER — Ambulatory Visit: Payer: Self-pay | Attending: Plastic Surgery | Admitting: Occupational Therapy

## 2021-02-23 ENCOUNTER — Other Ambulatory Visit: Payer: Self-pay

## 2021-02-23 DIAGNOSIS — R601 Generalized edema: Secondary | ICD-10-CM | POA: Insufficient documentation

## 2021-02-23 DIAGNOSIS — M6281 Muscle weakness (generalized): Secondary | ICD-10-CM | POA: Insufficient documentation

## 2021-02-23 DIAGNOSIS — M79645 Pain in left finger(s): Secondary | ICD-10-CM | POA: Insufficient documentation

## 2021-02-23 DIAGNOSIS — R278 Other lack of coordination: Secondary | ICD-10-CM | POA: Insufficient documentation

## 2021-02-23 DIAGNOSIS — M25642 Stiffness of left hand, not elsewhere classified: Secondary | ICD-10-CM | POA: Insufficient documentation

## 2021-02-23 NOTE — Therapy (Signed)
[facility] [address] [place], [place], [address] Phone: [phone]   Fax:  [phone]  Occupational Therapy Treatment  Patient Details  Name: CHARLE Sutton MRN: [id number] Date of Birth: 1996/07/21 Referring Provider (OT): Dr Arita Miss   Encounter Date: 02/23/2021   OT End of Session - 02/23/21 1621     Visit Number 4    Number of Visits 8    Date for OT Re-Evaluation 03/30/21    Authorization Type Pt is self pay    OT Start Time 0204    OT Stop Time 0219    OT Time Calculation (min) 15 min             No past medical history on file.  No past surgical history on file.  There were no vitals filed for this visit.   Subjective Assessment - 02/23/21 1620     Subjective  Pt brought in functional capacity w, he was instructed that MD will need to fill out(Therapist discussed with supervisior)    Pertinent History left small finger boutonniere deformity when playing basketball about a month ago. No significant PMH per pt report.    Patient Stated Goals Get use of finger/hand back.    Currently in Pain? No/denies               Treatment: Pt arrived wearing splint. Splint was removed and pt's finger was noted to be macerated. Pt maintained hand flat and finger was cleaned with hand gel. Therapist discussed importance of keeping finger dry and not bending finer while splint is off. New strapping and velcro was applied along with finger stockinette. Pt's splint is fitting well. Pt will continue to wear splint until he follows up with MD.                   OT Education - 02/24/21 1450     Education Details splint application    Person(s) Educated Patient    Methods Explanation;Demonstration    Comprehension Verbalized understanding;Returned demonstration              OT Short Term Goals - 02/08/21 0829       OT SHORT TERM GOAL #1   Title Pt will be Mod I splinting use, care and  precautions left small finger    Baseline need vc's    Time 4    Period Weeks    Status Achieved    Target Date 03/01/21      OT SHORT TERM GOAL #2   Title Pt will be Mod I edema control left small finger as seen by independent ability to state 2-3 edema control techniques    Baseline VC's required    Time 4    Period Weeks    Status On-going    Target Date 03/01/21               OT Long Term Goals - 02/01/21 0923       OT LONG TERM GOAL #1   Title Pt will be I upgraded splinting HEP    Baseline Depend.    Time 8    Period Weeks    Status New    Target Date 03/29/21      OT LONG TERM GOAL #2   Title Pt will be I HEP left small finger    Baseline Depend.    Time 8    Period Weeks    Status New    Target  Date 03/29/21      OT LONG TERM GOAL #3   Title Pt will demonstrate active ROM for Left small finger PIP flexion to within 1 cm of DPC (distal palmar crease).    Baseline unable    Time 8    Period Weeks    Status New    Target Date 03/29/21      OT LONG TERM GOAL #4   Title Pt will demonstrate improving functional grip as seen by JAMAR grip of 10# or more.    Baseline unable to assess    Time 8    Period Weeks    Status New    Target Date 03/29/21      OT LONG TERM GOAL #5   Title Pt will demonstrate active PIP extension left small finger of -10* or less    Baseline NT    Time 8    Period Weeks    Status New    Target Date 03/29/21                   Plan - 02/23/21 1622     Clinical Impression Statement Pt is progressing towards goals. He only required minor adjustments to splint strapping.    OT Occupational Profile and History Problem Focused Assessment - Including review of records relating to presenting problem    Occupational performance deficits (Please refer to evaluation for details): ADL's;IADL's    Body Structure / Function / Physical Skills ADL;Strength;Dexterity;Edema;UE functional  use;ROM;IADL;Coordination;Flexibility;Mobility;Decreased knowledge of precautions;FMC    Rehab Potential Good    Clinical Decision Making Limited treatment options, no task modification necessary    Comorbidities Affecting Occupational Performance: None    Modification or Assistance to Complete Evaluation  No modification of tasks or assist necessary to complete eval    OT Frequency 1x / week    OT Duration 8 weeks   May need to adjust as pt is self pay   OT Treatment/Interventions Self-care/ADL training;Fluidtherapy;Splinting;Therapeutic activities;Ultrasound;Therapeutic exercise;Passive range of motion;Cryotherapy;Electrical Stimulation;Paraffin;Manual Therapy;Patient/family education;Moist Heat    Plan Pt then to return after he sees MD on 03/09/21 to hopefully begin PIP motion    Consulted and Agree with Plan of Care Patient             Patient will benefit from skilled therapeutic intervention in order to improve the following deficits and impairments:   Body Structure / Function / Physical Skills: ADL, Strength, Dexterity, Edema, UE functional use, ROM, IADL, Coordination, Flexibility, Mobility, Decreased knowledge of precautions, [facility]       Visit Diagnosis: Stiffness of left hand, not elsewhere classified  Other lack of coordination  Muscle weakness (generalized)  Pain in left finger(s)  Generalized edema    Problem List Patient Active Problem List   Diagnosis Date Noted   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 [address] [place], [place], [address] Phone: [phone]   Fax:  [phone]  Name: Guy Sutton MRN: [id number] Date of Birth: 02/28/1996

## 2021-03-09 ENCOUNTER — Other Ambulatory Visit: Payer: Self-pay

## 2021-03-09 ENCOUNTER — Encounter: Payer: Self-pay | Admitting: Plastic Surgery

## 2021-03-09 ENCOUNTER — Ambulatory Visit (INDEPENDENT_AMBULATORY_CARE_PROVIDER_SITE_OTHER): Payer: Self-pay | Admitting: Plastic Surgery

## 2021-03-09 DIAGNOSIS — M20022 Boutonniere deformity of left finger(s): Secondary | ICD-10-CM

## 2021-03-09 NOTE — Progress Notes (Addendum)
Patient is a 25 year old male who with no significant past medical history presented to the ER on 01/12/2021 for evaluation of finger injury sustained while playing basketball.  I reviewed his medical record and he had plain films obtained that revealed swelling without evidence of fracture or dislocation.  On exam however, his finger was hyperextended at the DIP joint suggestive of a potential flexor tendon injury.  He was placed in finger splint and referred to Dr. Arita Miss for ongoing evaluation and management.  Patient was then seen here in clinic 01/25/2021 and diagnosed with acute closed boutonniere injury.  Recommendation was for leaving him in splint with PIP joint in full extension for 6 weeks while leaving the DIP and MCP joints free.  He was also referred to a hand therapist to help with management.  Plan was for him to return here to clinic in 6 weeks to determine whether or not he can proceed with therapy or require surgical intervention.  On my examination, patient states that he is glad to have the splint off.  He denies any significant pain symptoms and has not required any medications.  He has been doing well with the hand therapy.  His physical exam is reassuring.  He has difficulty with complete flexion at PIP joint.  He also has mild discomfort with extension of fifth finger against resistance.  Otherwise, he is functionally neurovascular intact.  Suspect that his strength and range of motion involving PIP joint will improve with continued outpatient therapy.  Patient is right-hand dominant.  I was asked to complete a functional capacity certificate for him given that he is in the [employer].  He mentions that he has had difficulty with certain exercises, particularly dead lift and pull-ups, due to his diminished ability to grip with left hand.  This is consistent with his boutonniere injury and recovery.  It should continue to improve with therapy.  Do not feel as though surgical intervention is  warranted.  We will provide temporary restrictions for dead lift, standing power throw, and hand release push-up for his ACFT event with the [employer].  He is otherwise cleared.  Patient is happy with his progress thus far.  Patient can remain out of the splint moving forward.  Advised him to return to the clinic in 6 weeks to ensure that he is continue to progress from functional standpoint.  Any pictures obtained of the patient and placed in the chart were with the patient's or guardian's permission.

## 2021-03-14 ENCOUNTER — Other Ambulatory Visit: Payer: Self-pay

## 2021-03-14 ENCOUNTER — Ambulatory Visit: Payer: Self-pay | Admitting: Occupational Therapy

## 2021-03-14 DIAGNOSIS — M6281 Muscle weakness (generalized): Secondary | ICD-10-CM

## 2021-03-14 DIAGNOSIS — M25642 Stiffness of left hand, not elsewhere classified: Secondary | ICD-10-CM

## 2021-03-14 DIAGNOSIS — R278 Other lack of coordination: Secondary | ICD-10-CM

## 2021-03-14 DIAGNOSIS — M79645 Pain in left finger(s): Secondary | ICD-10-CM

## 2021-03-14 NOTE — Patient Instructions (Signed)
   Can use ice after exercises for swelling or pain  Can use buddy strap as an assist for exercises as needed to straighten finger. If small finger begins to lag(stay bent) wear buddy strap at night.    MP Flexion (Active Isolated)   Bend __small ____ finger at large knuckle, keeping other fingers straight. Do not bend tips. Repeat _10-15___ times. Do __4-6__ sessions per day.  AROM: PIP Flexion / Extension   Pinch bottom knuckle of ___small _____ finger of hand to prevent bending. Actively bend middle knuckle until stretch is felt. Hold __5__ seconds. Relax. Straighten finger as far as possible. Repeat __10-15__ times per set. Do _4-6___ sessions per day. Make sure that finger straightens all the way   AROM: DIP Flexion / Extension   Pinch middle knuckle of ___small _____ finger of  hand to prevent bending. Bend end knuckle until stretch is felt. Hold _5___ seconds. Relax. Straighten finger as far as possible. Repeat _10-15___ times per set.  Do _4-6___ sessions per day.  AROM: Finger Flexion / Extension   Actively bend fingers of  hand. Start with knuckles furthest from palm, and slowly make a fist. Hold __5__ seconds. Relax. Then straighten fingers as far as possible. Repeat _10-15___ times per set.  Do _4-6___ sessions per day.  Copyright  VHI. All rights reserved.      Flexor Tendon Gliding (Active Hook Fist)   With fingers and knuckles straight, bend middle and tip joints. Do not bend large knuckles. Repeat _10-15___ times. Do _4-6___ sessions per day.  MP Flexion (Active)   With back of hand on table, bend large knuckles as far as they will go, keeping small joints straight. Repeat _10-15___ times. Do __4-6__ sessions per day. Activity: Reach into a narrow container.*      Finger Flexion / Extension   With palm up, bend fingers of left hand toward palm, making a  fist. Straighten fingers, opening fist. Repeat sequence _10-15___ times per session. Do  _4-6__ sessions per day. Hand Variation: Palm down   Copyright  VHI. All rights reserved.

## 2021-03-14 NOTE — Therapy (Signed)
[facility] [address] [place], [place], [address] Phone: [phone]   Fax:  [phone]  Occupational Therapy Treatment  Patient Details  Name: Guy Sutton MRN: [id number] Date of Birth: 1996/06/30 Referring Provider (OT): Dr Arita Miss   Encounter Date: 03/14/2021   OT End of Session - 03/14/21 1515     Visit Number 5    Number of Visits 8    Date for OT Re-Evaluation 03/30/21    Authorization Type Pt is self pay    OT Start Time 1452    OT Stop Time 1530    OT Time Calculation (min) 38 min    Activity Tolerance Patient tolerated treatment well    Behavior During Therapy [facility] for tasks assessed/performed             No past medical history on file.  No past surgical history on file.  There were no vitals filed for this visit.   Subjective Assessment - 03/14/21 1455     Subjective  Pt reports hand pain with over use    Pertinent History left small finger boutonniere deformity when playing basketball about a month ago. No significant PMH per pt report.    Patient Stated Goals Get use of finger/hand back.    Currently in Pain? No/denies   no pain currently, but he has pain at times with overuse, weights greater than 15 lbs             Treatment: MD d/c pt's splint and cleared him for ROM. MD restricted heavier use of LUE. Paraffin x 10 mins to LUE with hand fisted, for stiffness, no adverse reactions. Pt was instructed in A/ROM HEP for tendon gliding, and IP flexion/ extension. Pt was issued buddy strap for AA/ROM and instructed he may use during functional activity or if he notes an extensor lag. Reverse blocking exercises and active flexion with passive PIP extension exercises. Pt reports he is lifting 15 lbs at gym. Therapist instructed pt to avoid heavier weight until pain goes away. Ice pack x 8 mins at end of session for edema. No adverse reactions.                  OT Education -  03/14/21 1530     Education Details Pt was educated regarding inital HEP for A/ROM and P/ROM exercises see pt instructions    Person(s) Educated Patient    Methods Explanation;Demonstration;Handout;Verbal cues    Comprehension Verbalized understanding;Returned demonstration              OT Short Term Goals - 03/14/21 1517       OT SHORT TERM GOAL #1   Title Pt will be Mod I splinting use, care and precautions left small finger    Baseline need vc's    Time 4    Period Weeks    Status Achieved    Target Date 03/01/21      OT SHORT TERM GOAL #2   Title Pt will be Mod I edema control left small finger as seen by independent ability to state 2-3 edema control techniques    Baseline VC's required    Time 4    Period Weeks    Status On-going   reveiwed, needs reinforcement   Target Date 03/01/21               OT Long Term Goals - 03/14/21 1516       OT LONG TERM GOAL #1  Title Pt will be I upgraded splinting HEP    Status On-going      OT LONG TERM GOAL #2   Title Pt will be I HEP left small finger    Status On-going      OT LONG TERM GOAL #3   Title Pt will demonstrate active ROM for Left small finger PIP flexion to within 1 cm of DPC (distal palmar crease).    Status On-going      OT LONG TERM GOAL #4   Title Pt will demonstrate improving functional grip as seen by JAMAR grip of 10# or more.    Status On-going      OT LONG TERM GOAL #5   Title Pt will demonstrate active PIP extension left small finger of -10* or less    Status On-going                   Plan - 03/14/21 1532     Clinical Impression Statement Pt is progressing towards goals. He demonstrates improving ROM with exercises .    OT Occupational Profile and History Problem Focused Assessment - Including review of records relating to presenting problem    Occupational performance deficits (Please refer to evaluation for details): ADL's;IADL's    Body Structure / Function / Physical  Skills ADL;Strength;Dexterity;Edema;UE functional use;ROM;IADL;Coordination;Flexibility;Mobility;Decreased knowledge of precautions;FMC    Rehab Potential Good    Clinical Decision Making Limited treatment options, no task modification necessary    OT Duration 8 weeks    OT Treatment/Interventions Self-care/ADL training;Fluidtherapy;Splinting;Therapeutic activities;Ultrasound;Therapeutic exercise;Passive range of motion;Cryotherapy;Electrical Stimulation;Paraffin;Manual Therapy;Patient/family education;Moist Heat    Plan review/ progress HEP, check goals- d/c vs. schedule additonal visits    Consulted and Agree with Plan of Care Patient             Patient will benefit from skilled therapeutic intervention in order to improve the following deficits and impairments:   Body Structure / Function / Physical Skills: ADL, Strength, Dexterity, Edema, UE functional use, ROM, IADL, Coordination, Flexibility, Mobility, Decreased knowledge of precautions, [facility]       Visit Diagnosis: Stiffness of left hand, not elsewhere classified  Other lack of coordination  Muscle weakness (generalized)  Pain in left finger(s)    Problem List Patient Active Problem List   Diagnosis Date Noted   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 [place], [place], [address] Phone: [phone]   Fax:  [phone]  Name: CURVIN HUNGER MRN: [id number] Date of Birth: [birth year]

## 2021-03-21 ENCOUNTER — Ambulatory Visit: Payer: Self-pay | Admitting: Occupational Therapy

## 2021-04-04 ENCOUNTER — Ambulatory Visit: Payer: Self-pay | Attending: Plastic Surgery | Admitting: Occupational Therapy

## 2021-04-20 ENCOUNTER — Ambulatory Visit: Payer: Self-pay | Admitting: Plastic Surgery

## 2021-08-25 ENCOUNTER — Other Ambulatory Visit: Payer: Self-pay

## 2021-08-25 ENCOUNTER — Ambulatory Visit
Admission: EM | Admit: 2021-08-25 | Discharge: 2021-08-25 | Disposition: A | Discharge status: Home / Self Care | Payer: Self-pay | Attending: Emergency Medicine | Admitting: Emergency Medicine

## 2021-08-25 DIAGNOSIS — Z209 Contact with and (suspected) exposure to unspecified communicable disease: Secondary | ICD-10-CM

## 2021-08-25 DIAGNOSIS — R058 Other specified cough: Secondary | ICD-10-CM

## 2021-08-25 NOTE — ED Provider Notes (Signed)
[facility]    CSN: [id number] Arrival date & time: 08/25/21  1435    HISTORY   Chief Complaint  Patient presents with   Covid Exposure   Cough   HPI Guy Sutton is a 26 y.o. male. Pt requesting to be tested for Covid d/t recent suspected exposure to his wife who complains of symptoms consistent with COVID-19, patient reports having a scratchy throat and a dry cough.  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  Patient denies fever, aches, chills, nausea, vomiting, diarrhea, headache, loss of taste or smell.  Patient is well-appearing on arrival with essentially normal vital signs, temperature is mildly elevated at 99.2.  The history is provided by the patient.  History reviewed. No pertinent past medical history. 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 No pertinent surgical history.  Home Medications    Prior to Admission medications   Not on File   Family History Family History  Problem Relation Age of Onset   Healthy Mother    Healthy Father    Social History Social History   Tobacco Use   Smoking status: Never   Smokeless tobacco: Never  Vaping Use   Vaping Use: Never used  Substance Use Topics   Alcohol use: Not Currently   Drug use: No   Allergies   Patient has no known allergies.  Review of Systems Review of Systems Pertinent findings noted in history of present illness.   Physical Exam Triage Vital Signs ED Triage Vitals  Enc Vitals Group     BP 05/19/21 0827 (!) 147/82     Pulse Rate 05/19/21 0827 72     Resp 05/19/21 0827 18     Temp 05/19/21 0827 98.3 F (36.8 C)     Temp Source 05/19/21 0827 Oral     SpO2 05/19/21 0827 98 %     Weight --      Height --      Head  Circumference --      Peak Flow --      Pain Score 05/19/21 0826 5     Pain Loc --      Pain Edu? --      Excl. in GC? --   No data found.  Updated Vital Signs BP 119/74 (BP Location: Right Arm)    Pulse 68    Temp 99.2 F (37.3 C) (Oral)    Resp 20    SpO2 97%   Physical Exam Vitals and nursing note reviewed.  Constitutional:      General: He is not in acute distress.    Appearance: Normal appearance. He is not ill-appearing.  HENT:     Head: Normocephalic and atraumatic.     Salivary Glands: Right salivary gland is not diffusely enlarged or tender. Left salivary gland is not diffusely enlarged or tender.     Right Ear: Tympanic membrane, ear canal and external ear normal. No drainage. No middle ear effusion. There is no impacted cerumen. Tympanic membrane is not erythematous or bulging.     Left Ear: Tympanic membrane, ear canal and external ear normal. No drainage.  No middle ear effusion. There is no impacted cerumen. Tympanic membrane is  not erythematous or bulging.     Nose: Nose normal. No nasal deformity, septal deviation, mucosal edema, congestion or rhinorrhea.     Right Turbinates: Not enlarged, swollen or pale.     Left Turbinates: Not enlarged, swollen or pale.     Right Sinus: No maxillary sinus tenderness or frontal sinus tenderness.     Left Sinus: No maxillary sinus tenderness or frontal sinus tenderness.     Mouth/Throat:     Lips: Pink. No lesions.     Mouth: Mucous membranes are moist. No oral lesions.     Pharynx: Oropharynx is clear. Uvula midline. No posterior oropharyngeal erythema or uvula swelling.     Tonsils: No tonsillar exudate. 0 on the right. 0 on the left.  Eyes:     General: Lids are normal.        Right eye: No discharge.        Left eye: No discharge.     Extraocular Movements: Extraocular movements intact.     Conjunctiva/sclera: Conjunctivae normal.     Right eye: Right conjunctiva is not injected.     Left eye: Left conjunctiva is not  injected.  Neck:     Trachea: Trachea and phonation normal.  Cardiovascular:     Rate and Rhythm: Normal rate and regular rhythm.     Pulses: Normal pulses.     Heart sounds: Normal heart sounds. No murmur heard.   No friction rub. No gallop.  Pulmonary:     Effort: Pulmonary effort is normal. No accessory muscle usage, prolonged expiration or respiratory distress.     Breath sounds: Normal breath sounds. No stridor, decreased air movement or transmitted upper airway sounds. No decreased breath sounds, wheezing, rhonchi or rales.  Chest:     Chest wall: No tenderness.  Musculoskeletal:        General: Normal range of motion.     Cervical back: Normal range of motion and neck supple. Normal range of motion.  Lymphadenopathy:     Cervical: No cervical adenopathy.  Skin:    General: Skin is warm and dry.     Findings: No erythema or rash.  Neurological:     General: No focal deficit present.     Mental Status: He is alert and oriented to person, place, and time.  Psychiatric:        Mood and Affect: Mood normal.        Behavior: Behavior normal.    Visual Acuity Right Eye Distance:   Left Eye Distance:   Bilateral Distance:    Right Eye Near:   Left Eye Near:    Bilateral Near:     UC Couse / Diagnostics / Procedures:    EKG  Radiology No results found.  Procedures Procedures (including critical care time)  UC Diagnoses / Final Clinical Impressions(s)   I have reviewed the triage vital signs and the nursing notes.  Pertinent labs & imaging results that were available during my care of the patient were reviewed by me and considered in my medical decision making (see chart for details).   Final diagnoses:  Dry cough  Exposure to communicable disease   COVID testing performed at patient request.  Patient will be notified of results via MyChart, if positive he will receive a phone call with further instructions if any.  Conservative care recommended.  Return  precautions advised.  Note provided for work.   ED Prescriptions   None    PDMP not reviewed this encounter.  Pending results:  Labs Reviewed  NOVEL CORONAVIRUS, NAA    Medications Ordered in UC: Medications - No data to display  Disposition Upon Discharge:  Condition: stable for discharge home Home: take medications as prescribed; routine discharge instructions as discussed; follow up as advised.  Patient presented with an acute illness with associated systemic symptoms and significant discomfort requiring urgent management. In my opinion, this is a condition that a prudent lay person (someone who possesses an average knowledge of health and medicine) may potentially expect to result in complications if not addressed urgently such as respiratory distress, impairment of bodily function or dysfunction of bodily organs.   Routine symptom specific, illness specific and/or disease specific instructions were discussed with the patient and/or caregiver at length.   As such, the patient has been evaluated and assessed, work-up was performed and treatment was provided in alignment with urgent care protocols and evidence based medicine.  Patient/parent/caregiver has been advised that the patient may require follow up for further testing and treatment if the symptoms continue in spite of treatment, as clinically indicated and appropriate.  If the patient was tested for COVID-19, Influenza and/or RSV, then the patient/parent/guardian was advised to isolate at home pending the results of his/her diagnostic coronavirus test and potentially longer if theyre positive. I have also advised pt that if his/her COVID-19 test returns positive, it's recommended to self-isolate for at least 10 days after symptoms first appeared AND until fever-free for 24 hours without fever reducer AND other symptoms have improved or resolved. Discussed self-isolation recommendations as well as instructions for household  member/close contacts as per the [facility] and [place] DHHS, and also gave patient the COVID packet with this information.  Patient/parent/caregiver has been advised to [facility] or [facility]CP in 3-5 days if no better; to PCP or the Emergency Department if new signs and symptoms develop, or if the current signs or symptoms continue to change or worsen for further workup, evaluation and treatment as clinically indicated and appropriate  The patient will follow up with their current PCP if and as advised. If the patient does not currently have a PCP we will assist them in obtaining one.   The patient may need specialty follow up if the symptoms continue, in spite of conservative treatment and management, for further workup, evaluation, consultation and treatment as clinically indicated and appropriate.  Patient/parent/caregiver verbalized understanding and agreement of plan as discussed.  All questions were addressed during visit.  Please see discharge instructions below for further details of plan.  Discharge Instructions:   Discharge Instructions      Your COVID test will be posted to your MyChart account usually within the next 24 to 48 hours.  If you have a positive result, you will be contacted by phone.  I have provided you with a note stating that you were awaiting results of COVID testing.  Please continue to monitor your symptoms for worsening shortness of breath, fever, aches, chills, please return for follow-up if you feel you need repeat evaluation.      This office note has been dictated using Teaching laboratory technicianDragon speech recognition software.  Unfortunately, and despite my best efforts, this method of dictation can sometimes lead to occasional typographical or grammatical errors.  I apologize in advance if this occurs.     Theadora RamaMorgan, Synia Douglass Scales, [place]PA-C 08/25/21 [phone]

## 2021-08-25 NOTE — Discharge Instructions (Addendum)
Your COVID test will be posted to your MyChart account usually within the next 24 to 48 hours.  If you have a positive result, you will be contacted by phone.  I have provided you with a note stating that you were awaiting results of COVID testing.  Please continue to monitor your symptoms for worsening shortness of breath, fever, aches, chills, please return for follow-up if you feel you need repeat evaluation.

## 2021-08-25 NOTE — ED Triage Notes (Signed)
Pt requesting to be tested for Covid (d/t exposure) patient reports having a scratchy throat and a cough.

## 2021-08-26 LAB — SARS-COV-2, NAA 2 DAY TAT

## 2021-08-26 LAB — NOVEL CORONAVIRUS, NAA: SARS-CoV-2, NAA: NOT DETECTED

## 2021-08-29 ENCOUNTER — Ambulatory Visit
Admission: EM | Admit: 2021-08-29 | Discharge: 2021-08-29 | Disposition: A | Discharge status: Home / Self Care | Payer: Self-pay | Attending: Emergency Medicine | Admitting: Emergency Medicine

## 2021-08-29 ENCOUNTER — Other Ambulatory Visit: Payer: Self-pay

## 2021-08-29 DIAGNOSIS — Z113 Encounter for screening for infections with a predominantly sexual mode of transmission: Secondary | ICD-10-CM | POA: Insufficient documentation

## 2021-08-29 NOTE — ED Provider Notes (Signed)
[facility]    CSN: [id number] Arrival date & time: 08/29/21  0998    HISTORY   Chief Complaint  Patient presents with   Exposure to STD   HPI Guy Sutton is a 26 y.o. male. Pt would like a STD screening, pt denies penile discharge, testicular/scrotal swelling/pain, genital lesions, burning with urination, fever, abdominal pain, known exposure to STD  The history is provided by the patient.  History reviewed. No pertinent past medical history. 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 No pertinent surgical history.  Home Medications    Prior to Admission medications   Not on File   Family History Family History  Problem Relation Age of Onset   Healthy Mother    Healthy Father    Social History Social History   Tobacco Use   Smoking status: Never   Smokeless tobacco: Never  Vaping Use   Vaping Use: Never used  Substance Use Topics   Alcohol use: Not Currently   Drug use: No   Allergies   Patient has no known allergies.  Review of Systems Review of Systems Pertinent findings noted in history of present illness.   Physical Exam Triage Vital Signs ED Triage Vitals  Enc Vitals Group     BP 05/19/21 0827 (!) 147/82     Pulse Rate 05/19/21 0827 72     Resp 05/19/21 0827 18     Temp 05/19/21 0827 98.3 F (36.8 C)     Temp Source 05/19/21 0827 Oral     SpO2 05/19/21 0827 98 %     Weight --      Height --      Head Circumference --      Peak Flow --      Pain Score 05/19/21 0826 5     Pain Loc --      Pain Edu? --      Excl. in GC? --   No data found.  Updated Vital Signs BP 126/81 (BP Location: Left Arm)    Pulse 71    Temp 98.6 F (37 C) (Oral)    Resp 16    SpO2 96%   Physical Exam Vitals and nursing note reviewed.  Constitutional:      General: He is not in acute distress.    Appearance: Normal appearance. He is not  ill-appearing.  HENT:     Head: Normocephalic and atraumatic.  Eyes:     General: Lids are normal.        Right eye: No discharge.        Left eye: No discharge.     Extraocular Movements: Extraocular movements intact.     Conjunctiva/sclera: Conjunctivae normal.     Right eye: Right conjunctiva is not injected.     Left eye: Left conjunctiva is not injected.  Neck:     Trachea: Trachea and phonation normal.  Cardiovascular:     Rate and Rhythm: Normal rate and regular rhythm.     Pulses: Normal pulses.     Heart sounds: Normal heart sounds. No murmur heard.   No friction rub. No gallop.  Pulmonary:     Effort: Pulmonary effort is normal. No accessory muscle usage, prolonged expiration or respiratory distress.     Breath sounds: Normal breath sounds. No stridor, decreased air movement or transmitted upper airway sounds. No decreased breath sounds, wheezing, rhonchi or rales.  Chest:     Chest wall: No tenderness.  Genitourinary:    Comments: Pt politely declines GU exam, pt did provide a penile swab for testing.   Musculoskeletal:        General: Normal range of motion.     Cervical back: Normal range of motion and neck supple. Normal range of motion.  Lymphadenopathy:     Cervical: No cervical adenopathy.  Skin:    General: Skin is warm and dry.     Findings: No erythema or rash.  Neurological:     General: No focal deficit present.     Mental Status: He is alert and oriented to person, place, and time.  Psychiatric:        Mood and Affect: Mood normal.        Behavior: Behavior normal.    Visual Acuity Right Eye Distance:   Left Eye Distance:   Bilateral Distance:    Right Eye Near:   Left Eye Near:    Bilateral Near:     UC Couse / Diagnostics / Procedures:    EKG  Radiology No results found.  Procedures Procedures (including critical care time)  UC Diagnoses / Final Clinical Impressions(s)   I have reviewed the triage vital signs and the nursing  notes.  Pertinent labs & imaging results that were available during my care of the patient were reviewed by me and considered in my medical decision making (see chart for details).    Final diagnoses:  Screening examination for STD (sexually transmitted disease)   STD screening was performed, patient advised that the results be posted to their MyChart and if any of the results are positive, they will be notified by phone, further treatment will be provided as indicated based on results of STD screening. Return precautions advised.  Drug allergies reviewed, all questions addressed.     ED Prescriptions   None    PDMP not reviewed this encounter.  Pending results:  Labs Reviewed  RPR  HIV ANTIBODY (ROUTINE TESTING W REFLEX)  CYTOLOGY, (ORAL, ANAL, URETHRAL) ANCILLARY ONLY    Medications Ordered in UC: Medications - No data to display  Disposition Upon Discharge:  Condition: stable for discharge home  Patient presented with concern for an acute illness with associated systemic symptoms and significant discomfort requiring urgent management. In my opinion, this is a condition that a prudent lay person (someone who possesses an average knowledge of health and medicine) may potentially expect to result in complications if not addressed urgently such as respiratory distress, impairment of bodily function or dysfunction of bodily organs.   As such, the patient has been evaluated and assessed, work-up was performed and treatment was provided in alignment with urgent care protocols and evidence based medicine.  Patient/parent/caregiver has been advised that the patient may require follow up for further testing and/or treatment if the symptoms continue in spite of treatment, as clinically indicated and appropriate.  Routine symptom specific, illness specific and/or disease specific instructions were discussed with the patient and/or caregiver at length.  Prevention strategies for avoiding STD  exposure were also discussed.  The patient will follow up with their current PCP if and as advised. If the patient does not currently have a PCP we will assist them in obtaining one.   The patient may need specialty follow up if the symptoms continue, in spite of conservative treatment and management, for further workup, evaluation, consultation and treatment as clinically indicated and appropriate.  Patient/parent/caregiver verbalized understanding and agreement  of plan as discussed.  All questions were addressed during visit.  Please see discharge instructions below for further details of plan.  Discharge Instructions:   Discharge Instructions      The results of your STD testing today will be made available to you once received.  They will initially be posted to your MyChart and, if any of your results are abnormal, you will receive a phone call with those results along with further instructions regarding treatment.    Thank you for visiting urgent care today.  I appreciate the opportunity to participate in your care.     This office note has been dictated using Teaching laboratory technician.  Unfortunately, and despite my best efforts, this method of dictation can sometimes lead to occasional typographical or grammatical errors.  I apologize in advance if this occurs.      Theadora Rama Scales, PA-C 08/29/21 1015

## 2021-08-29 NOTE — ED Triage Notes (Signed)
Pt would like a STD screening, pt denies having any symptoms at this time.

## 2021-08-29 NOTE — Discharge Instructions (Addendum)
The results of your STD testing today will be made available to you once received.  They will initially be posted to your MyChart and, if any of your results are abnormal, you will receive a phone call with those results along with further instructions regarding treatment.  °  °Thank you for visiting urgent care today.  I appreciate the opportunity to participate in your care. °

## 2021-08-30 LAB — CYTOLOGY, (ORAL, ANAL, URETHRAL) ANCILLARY ONLY
Chlamydia: POSITIVE — AB
Comment: NEGATIVE
Comment: NEGATIVE
Comment: NORMAL
Neisseria Gonorrhea: POSITIVE — AB
Trichomonas: NEGATIVE

## 2021-08-30 LAB — HIV ANTIBODY (ROUTINE TESTING W REFLEX): HIV Screen 4th Generation wRfx: NONREACTIVE

## 2021-08-30 LAB — RPR: RPR Ser Ql: NONREACTIVE

## 2021-08-31 ENCOUNTER — Other Ambulatory Visit: Payer: Self-pay

## 2021-08-31 ENCOUNTER — Ambulatory Visit
Admission: EM | Admit: 2021-08-31 | Discharge: 2021-08-31 | Disposition: A | Discharge status: Home / Self Care | Payer: Self-pay | Attending: Emergency Medicine | Admitting: Emergency Medicine

## 2021-08-31 ENCOUNTER — Telehealth (HOSPITAL_COMMUNITY): Payer: Self-pay | Admitting: Emergency Medicine

## 2021-08-31 DIAGNOSIS — A749 Chlamydial infection, unspecified: Secondary | ICD-10-CM

## 2021-08-31 DIAGNOSIS — A549 Gonococcal infection, unspecified: Secondary | ICD-10-CM

## 2021-08-31 MED ORDER — CEFTRIAXONE SODIUM 500 MG IJ SOLR: 500.0000 mg | Freq: Once | INTRAMUSCULAR | Status: DC

## 2021-08-31 MED ORDER — DOXYCYCLINE HYCLATE 100 MG PO CAPS: 100.0000 mg | ORAL_CAPSULE | Freq: Two times a day (BID) | ORAL | 0 refills | Status: AC

## 2021-08-31 NOTE — ED Triage Notes (Signed)
Patient presents to [facility] for treatment of positive Gonorrhea with 500mg  of IM ROcephin.

## 2021-08-31 NOTE — Telephone Encounter (Signed)
Per protocol, patient will need treatment with IM Rocephin 500mg for positive Gonorrhea. Will also need treatment with Doxycycline  Results seen on mychart Attempted to reach patient x 1, LVM Prescription sent to pharmacy on file [facility] notified 

## 2021-09-05 IMAGING — CR DG FINGER LITTLE 2+V*L*
3 series · 3 of 3 positions shown · non-contrast
Comparison: None.

CLINICAL DATA: Left little finger injury

EXAM:
LEFT LITTLE FINGER 2+V

[x finger pa left]
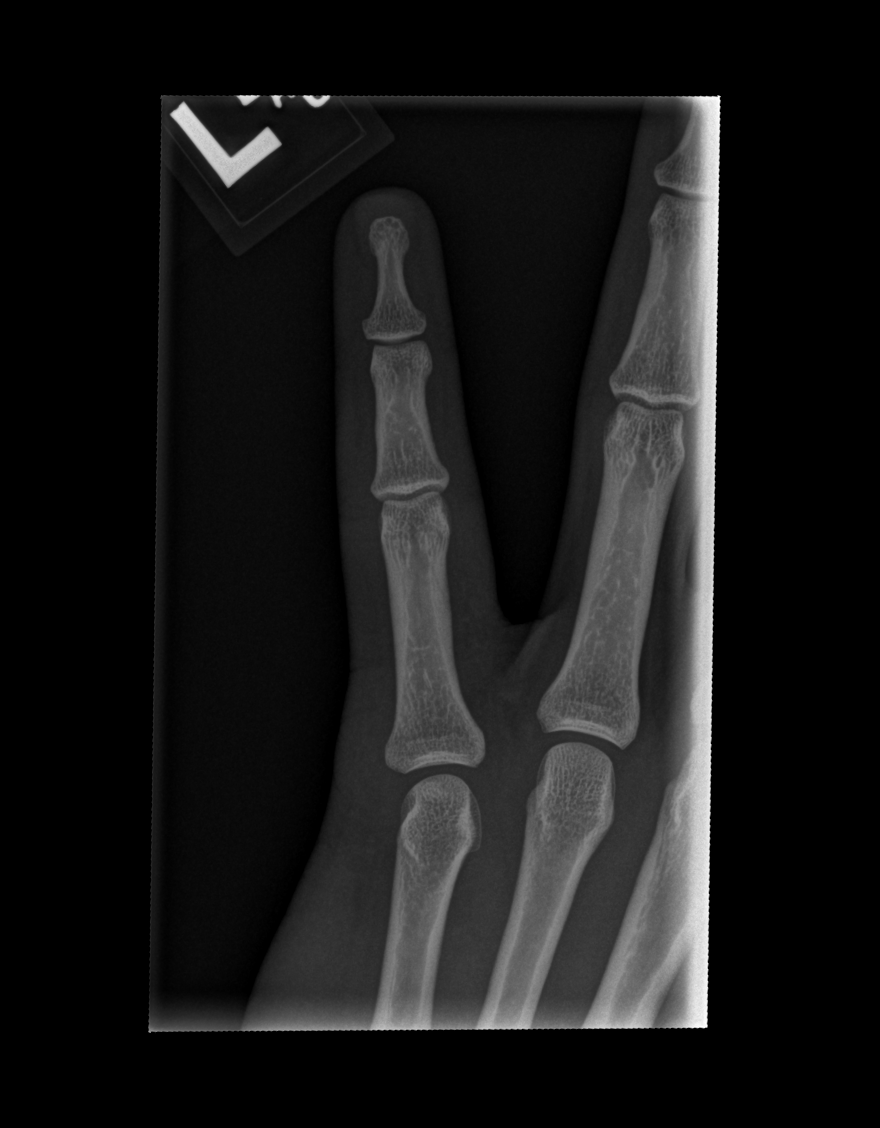

[x finger obl left]
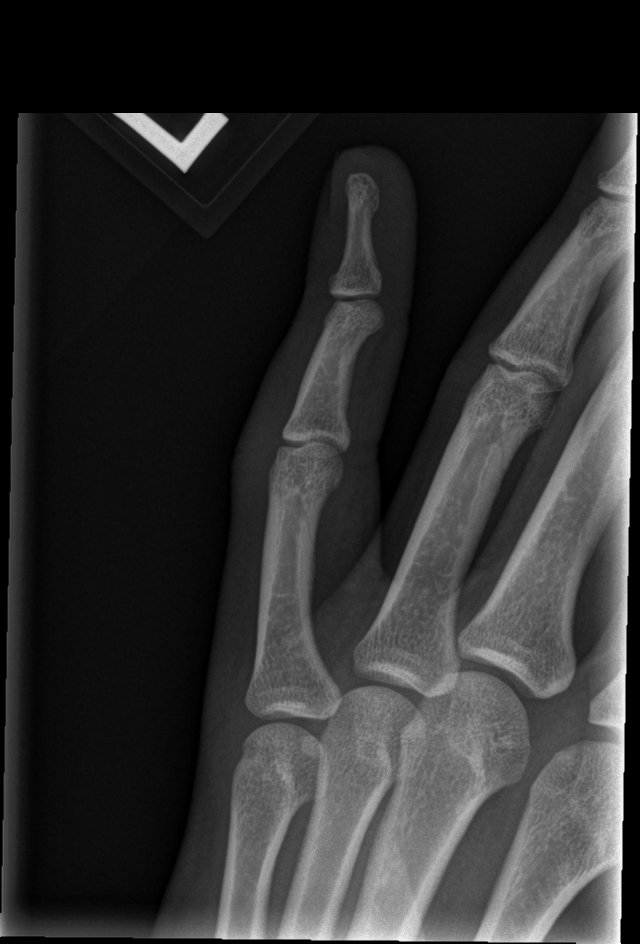

[x finger lat left]
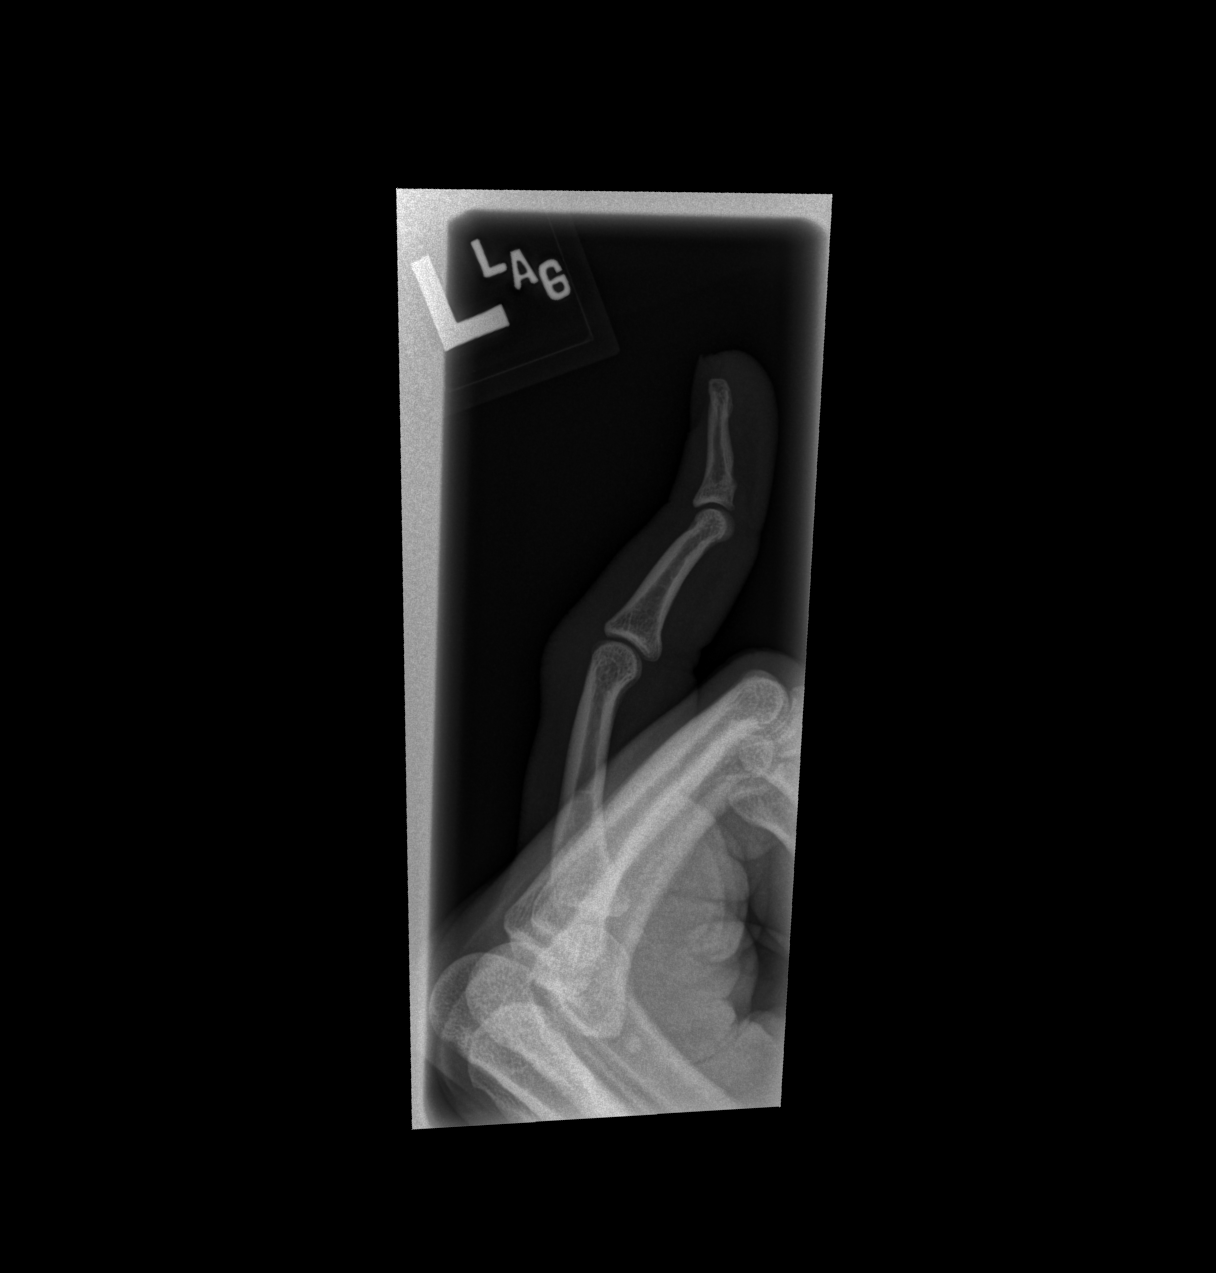

[3 of 3 positions shown; findings below may reference images not displayed]

FINDINGS: Normal alignment. No fracture or dislocation. Joint spaces are
preserved. Moderate soft tissue swelling surrounding the PIP joint.
IMPRESSION: Soft tissue swelling.  No fracture or dislocation.

## 2021-09-08 ENCOUNTER — Ambulatory Visit
Admission: EM | Admit: 2021-09-08 | Discharge: 2021-09-08 | Disposition: A | Discharge status: Home / Self Care | Payer: Self-pay | Attending: Emergency Medicine | Admitting: Emergency Medicine

## 2021-09-08 ENCOUNTER — Other Ambulatory Visit: Payer: Self-pay

## 2021-09-08 DIAGNOSIS — Z113 Encounter for screening for infections with a predominantly sexual mode of transmission: Secondary | ICD-10-CM | POA: Insufficient documentation

## 2021-09-08 DIAGNOSIS — Z8619 Personal history of other infectious and parasitic diseases: Secondary | ICD-10-CM | POA: Insufficient documentation

## 2021-09-08 NOTE — ED Triage Notes (Signed)
Pt requesting to be tested for STDs, denies symptoms.  

## 2021-09-08 NOTE — ED Provider Notes (Signed)
[facility]    CSN: [id number] Arrival date & time: 09/08/21  1300    HISTORY   Chief Complaint  Patient presents with   SEXUALLY TRANSMITTED DISEASE   HPI Guy Sutton is a 26 y.o. male. Pt requesting to be tested for STDs, denies symptoms.  Patient states he was recently treated for chlamydia and gonorrhea.  The history is provided by the patient.  History reviewed. No pertinent past medical history. 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 No pertinent surgical history.  Home Medications    Prior to Admission medications   Not on File   Family History Family History  Problem Relation Age of Onset   Healthy Mother    Healthy Father    Social History Social History   Tobacco Use   Smoking status: Never   Smokeless tobacco: Never  Vaping Use   Vaping Use: Never used  Substance Use Topics   Alcohol use: Not Currently   Drug use: No   Allergies   Patient has no known allergies.  Review of Systems Review of Systems Pertinent findings noted in history of present illness.   Physical Exam Triage Vital Signs ED Triage Vitals  Enc Vitals Group     BP 05/19/21 0827 (!) 147/82     Pulse Rate 05/19/21 0827 72     Resp 05/19/21 0827 18     Temp 05/19/21 0827 98.3 F (36.8 C)     Temp Source 05/19/21 0827 Oral     SpO2 05/19/21 0827 98 %     Weight --      Height --      Head Circumference --      Peak Flow --      Pain Score 05/19/21 0826 5     Pain Loc --      Pain Edu? --      Excl. in GC? --   No data found.  Updated Vital Signs BP 125/79 (BP Location: Right Arm)    Pulse 62    Temp 98.3 F (36.8 C) (Oral)    Resp 20    SpO2 96%   Physical Exam Vitals and nursing note reviewed.  Constitutional:      General: He is not in acute distress.    Appearance: Normal appearance. He is not ill-appearing.  HENT:     Head: Normocephalic and  atraumatic.  Eyes:     General: Lids are normal.        Right eye: No discharge.        Left eye: No discharge.     Extraocular Movements: Extraocular movements intact.     Conjunctiva/sclera: Conjunctivae normal.     Right eye: Right conjunctiva is not injected.     Left eye: Left conjunctiva is not injected.  Neck:     Trachea: Trachea and phonation normal.  Cardiovascular:     Rate and Rhythm: Normal rate and regular rhythm.     Pulses: Normal pulses.     Heart sounds: Normal heart sounds. No murmur heard.   No friction rub. No gallop.  Pulmonary:     Effort: Pulmonary effort is normal. No accessory muscle usage, prolonged expiration or respiratory distress.     Breath sounds: Normal breath sounds. No stridor, decreased air movement or transmitted upper airway sounds. No decreased breath sounds, wheezing, rhonchi or rales.  Chest:  Chest wall: No tenderness.  Genitourinary:    Comments: Pt politely declines GU exam, pt did provide a penile swab for testing.   Musculoskeletal:        General: Normal range of motion.     Cervical back: Normal range of motion and neck supple. Normal range of motion.  Lymphadenopathy:     Cervical: No cervical adenopathy.  Skin:    General: Skin is warm and dry.     Findings: No erythema or rash.  Neurological:     General: No focal deficit present.     Mental Status: He is alert and oriented to person, place, and time.  Psychiatric:        Mood and Affect: Mood normal.        Behavior: Behavior normal.    Visual Acuity Right Eye Distance:   Left Eye Distance:   Bilateral Distance:    Right Eye Near:   Left Eye Near:    Bilateral Near:     UC Couse / Diagnostics / Procedures:    EKG  Radiology No results found.  Procedures Procedures (including critical care time)  UC Diagnoses / Final Clinical Impressions(s)   I have reviewed the triage vital signs and the nursing notes.  Pertinent labs & imaging results that were  available during my care of the patient were reviewed by me and considered in my medical decision making (see chart for details).    Final diagnoses:  None   STD screening was performed, patient advised that the results be posted to their MyChart and if any of the results are positive, they will be notified by phone, further treatment will be provided as indicated based on results of STD screening. Return precautions advised.  Drug allergies reviewed, all questions addressed.     ED Prescriptions   None    PDMP not reviewed this encounter.  Pending results:  Labs Reviewed  CYTOLOGY, (ORAL, ANAL, URETHRAL) ANCILLARY ONLY    Medications Ordered in UC: Medications - No data to display  Disposition Upon Discharge:  Condition: stable for discharge home  Patient presented with concern for an acute illness with associated systemic symptoms and significant discomfort requiring urgent management. In my opinion, this is a condition that a prudent lay person (someone who possesses an average knowledge of health and medicine) may potentially expect to result in complications if not addressed urgently such as respiratory distress, impairment of bodily function or dysfunction of bodily organs.   As such, the patient has been evaluated and assessed, work-up was performed and treatment was provided in alignment with urgent care protocols and evidence based medicine.  Patient/parent/caregiver has been advised that the patient may require follow up for further testing and/or treatment if the symptoms continue in spite of treatment, as clinically indicated and appropriate.  Routine symptom specific, illness specific and/or disease specific instructions were discussed with the patient and/or caregiver at length.  Prevention strategies for avoiding STD exposure were also discussed.  The patient will follow up with their current PCP if and as advised. If the patient does not currently have a PCP we will  assist them in obtaining one.   The patient may need specialty follow up if the symptoms continue, in spite of conservative treatment and management, for further workup, evaluation, consultation and treatment as clinically indicated and appropriate.  Patient/parent/caregiver verbalized understanding and agreement of plan as discussed.  All questions were addressed during visit.  Please see discharge instructions below for further details  of plan.  Discharge Instructions: Discharge Instructions   None     This office note has been dictated using Dragon speech recognition software.  Unfortunately, and despite my best efforts, this method of dictation can sometimes lead to occasional typographical or grammatical errors.  I apologize in advance if this occurs.      Theadora Rama Scales, PA-C 09/08/21 1400

## 2021-09-11 LAB — CYTOLOGY, (ORAL, ANAL, URETHRAL) ANCILLARY ONLY
Chlamydia: NEGATIVE
Comment: NEGATIVE
Comment: NEGATIVE
Comment: NORMAL
Neisseria Gonorrhea: NEGATIVE
Trichomonas: NEGATIVE

## 2022-07-31 ENCOUNTER — Other Ambulatory Visit: Payer: Self-pay

## 2022-07-31 ENCOUNTER — Ambulatory Visit
Admission: EM | Admit: 2022-07-31 | Discharge: 2022-07-31 | Disposition: A | Discharge status: Home / Self Care | Payer: Self-pay | Attending: Emergency Medicine | Admitting: Emergency Medicine

## 2022-07-31 ENCOUNTER — Encounter: Payer: Self-pay | Admitting: Emergency Medicine

## 2022-07-31 DIAGNOSIS — Z113 Encounter for screening for infections with a predominantly sexual mode of transmission: Secondary | ICD-10-CM | POA: Insufficient documentation

## 2022-07-31 NOTE — ED Provider Notes (Signed)
[facility]    CSN: [id number] Arrival date & time: 07/31/22  3267    HISTORY   Chief Complaint  Patient presents with   SEXUALLY TRANSMITTED DISEASE   HPI Guy Sutton is a pleasant, 27 y.o. male who presents to urgent care today. Patient is requesting STD screening.  Patient denies known exposure to STD, penile discharge, burning with urination, testicular pain or swelling, scrotal pain or swelling, perineal pain, pain with defecation, abdominal pain, fever, genital lesion.  Patient does report often engaging in sexual intercourse without the use of a condom.  The history is provided by the patient.   History reviewed. No pertinent past medical history. 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 No pertinent surgical history.  Home Medications    Prior to Admission medications   Not on File    Family History Family History  Problem Relation Age of Onset   Healthy Mother    Healthy Father    Social History Social History   Tobacco Use   Smoking status: Never   Smokeless tobacco: Never  Vaping Use   Vaping Use: Never used  Substance Use Topics   Alcohol use: Not Currently   Drug use: No   Allergies   Patient has no known allergies.  Review of Systems Review of Systems Pertinent findings revealed after performing a 14 point review of systems has been noted in the history of present illness.  Physical Exam Triage Vital Signs ED Triage Vitals  Enc Vitals Group     BP 05/19/21 0827 (!) 147/82     Pulse Rate 05/19/21 0827 72     Resp 05/19/21 0827 18     Temp 05/19/21 0827 98.3 F (36.8 C)     Temp Source 05/19/21 0827 Oral     SpO2 05/19/21 0827 98 %     Weight --      Height --      Head Circumference --      Peak Flow --      Pain Score 05/19/21 0826 5     Pain Loc --      Pain Edu? --      Excl. in GC? --   No data found.  Updated  Vital Signs BP 127/85 (BP Location: Right Arm)   Pulse 65   Temp 98.6 F (37 C) (Oral)   Resp 18   Ht 6\' 2"  (1.88 m)   Wt 224 lb (101.6 kg)   SpO2 98%   BMI 28.76 kg/m   Physical Exam Vitals and nursing note reviewed.  Constitutional:      General: He is not in acute distress.    Appearance: Normal appearance. He is not ill-appearing.  HENT:     Head: Normocephalic and atraumatic.  Eyes:     General: Lids are normal.        Right eye: No discharge.        Left eye: No discharge.     Extraocular Movements: Extraocular movements intact.     Conjunctiva/sclera: Conjunctivae normal.     Right eye: Right conjunctiva is not injected.     Left eye: Left conjunctiva is not injected.  Neck:     Trachea: Trachea and phonation normal.  Cardiovascular:     Rate and Rhythm: Normal rate and regular rhythm.     Pulses: Normal pulses.     Heart  sounds: Normal heart sounds. No murmur heard.    No friction rub. No gallop.  Pulmonary:     Effort: Pulmonary effort is normal. No accessory muscle usage, prolonged expiration or respiratory distress.     Breath sounds: Normal breath sounds. No stridor, decreased air movement or transmitted upper airway sounds. No decreased breath sounds, wheezing, rhonchi or rales.  Chest:     Chest wall: No tenderness.  Genitourinary:    Comments: Pt politely declines GU exam, pt did provide a penile swab for testing.   Musculoskeletal:        General: Normal range of motion.     Cervical back: Normal range of motion and neck supple. Normal range of motion.  Lymphadenopathy:     Cervical: No cervical adenopathy.  Skin:    General: Skin is warm and dry.     Findings: No erythema or rash.  Neurological:     General: No focal deficit present.     Mental Status: He is alert and oriented to person, place, and time.  Psychiatric:        Mood and Affect: Mood normal.        Behavior: Behavior normal.     Visual Acuity Right Eye Distance:   Left Eye  Distance:   Bilateral Distance:    Right Eye Near:   Left Eye Near:    Bilateral Near:     UC Couse / Diagnostics / Procedures:     Radiology No results found.  Procedures Procedures (including critical care time) EKG  Pending results:  Labs Reviewed  CYTOLOGY, (ORAL, ANAL, URETHRAL) ANCILLARY ONLY    Medications Ordered in UC: Medications - No data to display  UC Diagnoses / Final Clinical Impressions(s)   I have reviewed the triage vital signs and the nursing notes.  Pertinent labs & imaging results that were available during my care of the patient were reviewed by me and considered in my medical decision making (see chart for details).    Final diagnoses:  Screening examination for STD (sexually transmitted disease)   STD screening was performed, patient advised that the results be posted to their MyChart and if any of the results are positive, they will be notified by phone, further treatment will be provided as indicated based on results of STD screening. Patient was advised to abstain from sexual intercourse until that they receive the results of their STD testing.  Patient was also advised to use condoms to protect themselves from STD exposure.    Please see discharge instructions below for details of plan of care as provided to patient. ED Prescriptions   None    PDMP not reviewed this encounter.  Disposition Upon Discharge:  Condition: stable for discharge home  Patient presented with concern for an acute illness with associated systemic symptoms and significant discomfort requiring urgent management. In my opinion, this is a condition that a prudent lay person (someone who possesses an average knowledge of health and medicine) may potentially expect to result in complications if not addressed urgently such as respiratory distress, impairment of bodily function or dysfunction of bodily organs.   As such, the patient has been evaluated and assessed, work-up  was performed and treatment was provided in alignment with urgent care protocols and evidence based medicine.  Patient/parent/caregiver has been advised that the patient may require follow up for further testing and/or treatment if the symptoms continue in spite of treatment, as clinically indicated and appropriate.  Routine symptom specific, illness specific  and/or disease specific instructions were discussed with the patient and/or caregiver at length.  Prevention strategies for avoiding STD exposure were also discussed.  The patient will follow up with their current PCP if and as advised. If the patient does not currently have a PCP we will assist them in obtaining one.   The patient may need specialty follow up if the symptoms continue, in spite of conservative treatment and management, for further workup, evaluation, consultation and treatment as clinically indicated and appropriate.  Patient/parent/caregiver verbalized understanding and agreement of plan as discussed.  All questions were addressed during visit.  Please see discharge instructions below for further details of plan.  Discharge Instructions:   Discharge Instructions      The results of your STD testing today which screens for gonorrhea, chlamydia, and trichomonas will be made posted to your MyChart account once it is complete.  This typically takes 2 to 4 days.  Please abstain from sexual intercourse of any kind, vaginal, oral or anal, until you have received the results of your STD testing.     If any of your results are abnormal, you will receive a phone call regarding treatment.  Prescriptions, if any are needed, will be provided for you at your pharmacy.     Thank you for visiting urgent care today.  I appreciate the opportunity to participate in your care.       This office note has been dictated using Teaching laboratory technician.  Unfortunately, this method of dictation can sometimes lead to typographical or  grammatical errors.  I apologize for your inconvenience in advance if this occurs.  Please do not hesitate to reach out to me if clarification is needed.       Theadora Rama Scales, PA-C 07/31/22 1156

## 2022-07-31 NOTE — Discharge Instructions (Signed)
The results of your STD testing today which screens for gonorrhea, chlamydia, and trichomonas will be made posted to your MyChart account once it is complete.  This typically takes 2 to 4 days.  Please abstain from sexual intercourse of any kind, vaginal, oral or anal, until you have received the results of your STD testing.     If any of your results are abnormal, you will receive a phone call regarding treatment.  Prescriptions, if any are needed, will be provided for you at your pharmacy.     Thank you for visiting urgent care today.  I appreciate the opportunity to participate in your care.  

## 2022-07-31 NOTE — ED Triage Notes (Signed)
Pt requesting to be check for STD as a regular check, denies any urinary or other symptoms at this time.

## 2022-08-02 ENCOUNTER — Telehealth (HOSPITAL_COMMUNITY): Payer: Self-pay | Admitting: Emergency Medicine

## 2022-08-02 LAB — CYTOLOGY, (ORAL, ANAL, URETHRAL) ANCILLARY ONLY
Chlamydia: NEGATIVE
Comment: NEGATIVE
Comment: NEGATIVE
Comment: NORMAL
Neisseria Gonorrhea: NEGATIVE
Trichomonas: POSITIVE — AB

## 2022-08-02 MED ORDER — METRONIDAZOLE 500 MG PO TABS: 2000.0000 mg | ORAL_TABLET | Freq: Once | ORAL | 0 refills | Status: AC

## 2022-08-12 ENCOUNTER — Ambulatory Visit
Admission: EM | Admit: 2022-08-12 | Discharge: 2022-08-12 | Disposition: A | Discharge status: Home / Self Care | Payer: Self-pay | Attending: Physician Assistant | Admitting: Physician Assistant

## 2022-08-12 DIAGNOSIS — Z113 Encounter for screening for infections with a predominantly sexual mode of transmission: Secondary | ICD-10-CM | POA: Insufficient documentation

## 2022-08-12 NOTE — ED Provider Notes (Signed)
[facility]    CSN: [id number] Arrival date & time: 08/12/22  1145      History   Chief Complaint Chief Complaint  Patient presents with   SEXUALLY TRANSMITTED DISEASE    HPI Guy Sutton is a 27 y.o. male.   Patient here today for STD screening after testing positive for trichomonas earlier this month.  He reports that he took medication as prescribed and just wants a recheck.  He denies any current symptoms.  He does not report any other concerns.  The history is provided by the patient.    History reviewed. No pertinent past medical history.  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 No pertinent surgical history.     Home Medications    Prior to Admission medications   Not on File    Family History Family History  Problem Relation Age of Onset   Healthy Mother    Healthy Father     Social History Social History   Tobacco Use   Smoking status: Never   Smokeless tobacco: Never  Vaping Use   Vaping Use: Never used  Substance Use Topics   Alcohol use: Not Currently   Drug use: No     Allergies   Patient has no known allergies.   Review of Systems Review of Systems  Constitutional:  Negative for chills and fever.  Eyes:  Negative for discharge and redness.  Gastrointestinal:  Negative for abdominal pain, nausea and vomiting.  Genitourinary:  Negative for dysuria, genital sores and penile discharge.  Musculoskeletal:  Negative for back pain.  Neurological:  Negative for numbness.     Physical Exam Triage Vital Signs ED Triage Vitals  Enc Vitals Group     BP 08/12/22 1158 130/83     Pulse Rate 08/12/22 1158 72     Resp 08/12/22 1158 16     Temp 08/12/22 1158 98.6 F (37 C)     Temp Source 08/12/22 1158 Oral     SpO2 08/12/22 1158 96 %     Weight --      Height --      Head Circumference --      Peak Flow --      Pain  Score 08/12/22 1202 0     Pain Loc --      Pain Edu? --      Excl. in [place]? --    No data found.  Updated Vital Signs BP 130/83 (BP Location: Left Arm)   Pulse 72   Temp 98.6 F (37 C) (Oral)   Resp 16   SpO2 96%   Physical Exam Vitals and nursing note reviewed.  Constitutional:      General: He is not in acute distress.    Appearance: Normal appearance. He is not ill-appearing.  HENT:     Head: Normocephalic and atraumatic.  Eyes:     Conjunctiva/sclera: Conjunctivae normal.  Cardiovascular:     Rate and Rhythm: Normal rate.  Pulmonary:     Effort: Pulmonary effort is normal. No respiratory distress.  Neurological:     Mental Status: He is alert.  Psychiatric:        Mood and Affect: Mood normal.        Behavior: Behavior normal.        Thought Content: Thought content normal.      UC Treatments / Results  Labs (all labs ordered are listed, but only abnormal results are displayed) Labs Reviewed  CYTOLOGY, (ORAL, ANAL, URETHRAL) ANCILLARY ONLY    EKG   Radiology No results found.  Procedures Procedures (including critical care time)  Medications Ordered in UC Medications - No data to display  Initial Impression / Assessment and Plan / UC Course  I have reviewed the triage vital signs and the nursing notes.  Pertinent labs & imaging results that were available during my care of the patient were reviewed by me and considered in my medical decision making (see chart for details).    STD screening ordered as requested.  Will await results further recommendation.  Encouraged follow-up with any further concerns.  Final Clinical Impressions(s) / UC Diagnoses   Final diagnoses:  Screening for STD (sexually transmitted disease)   Discharge Instructions   None    ED Prescriptions   None    PDMP not reviewed this encounter.   Francene Finders, PA-C 08/12/22 1208

## 2022-08-12 NOTE — ED Triage Notes (Signed)
Pt reports he was +STD 1/9-completed abx-wants retest-denies sx-NAD-steady gait

## 2022-08-14 LAB — CYTOLOGY, (ORAL, ANAL, URETHRAL) ANCILLARY ONLY
Chlamydia: NEGATIVE
Comment: NEGATIVE
Comment: NEGATIVE
Comment: NORMAL
Neisseria Gonorrhea: NEGATIVE
Trichomonas: NEGATIVE

## 2022-08-31 ENCOUNTER — Ambulatory Visit
Admission: EM | Admit: 2022-08-31 | Discharge: 2022-08-31 | Disposition: A | Discharge status: Home / Self Care | Payer: Self-pay | Attending: Nurse Practitioner | Admitting: Nurse Practitioner

## 2022-08-31 DIAGNOSIS — Z113 Encounter for screening for infections with a predominantly sexual mode of transmission: Secondary | ICD-10-CM | POA: Insufficient documentation

## 2022-08-31 NOTE — ED Provider Notes (Addendum)
[facility]    CSN: [id number] Arrival date & time: 08/31/22  1126      History   Chief Complaint Chief Complaint  Patient presents with   std check    Std check    HPI AKSHAT LITER is a 27 y.o. male presents for STD screening.  Patient would like routine screening and denies any symptoms including dysuria, penile discharge, testicular pain or swelling.  Denies any known STD exposure or concern.  No other concerns at this time.  HPI  History reviewed. No pertinent past medical history.  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 No pertinent surgical history.     Home Medications    Prior to Admission medications   Not on File    Family History Family History  Problem Relation Age of Onset   Healthy Mother    Healthy Father     Social History Social History   Tobacco Use   Smoking status: Never   Smokeless tobacco: Never  Vaping Use   Vaping Use: Never used  Substance Use Topics   Alcohol use: Not Currently   Drug use: No     Allergies   Patient has no known allergies.   Review of Systems Review of Systems  Genitourinary:        STD screening     Physical Exam Triage Vital Signs ED Triage Vitals  Enc Vitals Group     BP 08/31/22 1145 133/84     Pulse Rate 08/31/22 1145 80     Resp 08/31/22 1145 18     Temp 08/31/22 1145 97.6 F (36.4 C)     Temp Source 08/31/22 1145 Oral     SpO2 08/31/22 1145 95 %     Weight 08/31/22 1143 220 lb (99.8 kg)     Height 08/31/22 1143 6' 2"$  (1.88 m)     Head Circumference --      Peak Flow --      Pain Score 08/31/22 1143 0     Pain Loc --      Pain Edu? --      Excl. in [place]? --    No data found.  Updated Vital Signs BP 133/84 (BP Location: Right Arm)   Pulse 80   Temp 97.6 F (36.4 C) (Oral)   Resp 18   Ht 6' 2"$  (1.88 m)   Wt 220 lb (99.8 kg)   SpO2 95%   BMI 28.25 kg/m    Visual Acuity Right Eye Distance:   Left Eye Distance:   Bilateral Distance:    Right Eye Near:   Left Eye Near:    Bilateral Near:     Physical Exam Vitals and nursing note reviewed.  Constitutional:      Appearance: Normal appearance.  HENT:     Head: Normocephalic and atraumatic.  Eyes:     Pupils: Pupils are equal, round, and reactive to light.  Pulmonary:     Effort: Pulmonary effort is normal.  Skin:    General: Skin is warm and dry.  Neurological:     General: No focal deficit present.     Mental Status: He is alert and oriented to person, place, and time.  Psychiatric:        Mood and Affect: Mood normal.        Behavior: Behavior normal.      UC  Treatments / Results  Labs (all labs ordered are listed, but only abnormal results are displayed) Labs Reviewed  CYTOLOGY, (ORAL, ANAL, URETHRAL) ANCILLARY ONLY    EKG   Radiology No results found.  Procedures Procedures (including critical care time)  Medications Ordered in UC Medications - No data to display  Initial Impression / Assessment and Plan / UC Course  I have reviewed the triage vital signs and the nursing notes.  Pertinent labs & imaging results that were available during my care of the patient were reviewed by me and considered in my medical decision making (see chart for details).     STD testing as ordered above.  Will contact for any positive results Follow-up as needed Final Clinical Impressions(s) / UC Diagnoses   Final diagnoses:  Screening examination for STD (sexually transmitted disease)     Discharge Instructions      The clinic will contact you for any positive results    ED Prescriptions   None    PDMP not reviewed this encounter.   Melynda Ripple, NP 08/31/22 1150    Melynda Ripple, NP 08/31/22 [phone]

## 2022-08-31 NOTE — ED Notes (Signed)
Unable to successfully draw blood for STD labs on multiple attempts by nurse and CMA, patient made aware that he could go and get blood drawn at [place] also but stated that he doesn't need the blood work. Provider made aware.

## 2022-08-31 NOTE — Discharge Instructions (Signed)
The clinic will contact you for any positive results

## 2022-08-31 NOTE — ED Triage Notes (Signed)
Pt states that he wanted to be checked for std. Pt denies any symptoms.

## 2022-09-03 LAB — CYTOLOGY, (ORAL, ANAL, URETHRAL) ANCILLARY ONLY
Chlamydia: NEGATIVE
Comment: NEGATIVE
Comment: NEGATIVE
Comment: NORMAL
Neisseria Gonorrhea: NEGATIVE
Trichomonas: NEGATIVE

## 2024-01-18 ENCOUNTER — Other Ambulatory Visit: Payer: Self-pay

## 2024-01-18 ENCOUNTER — Ambulatory Visit
Admission: EM | Admit: 2024-01-18 | Discharge: 2024-01-18 | Disposition: A | Discharge status: Home / Self Care | Payer: Self-pay | Attending: Family Medicine | Admitting: Family Medicine

## 2024-01-18 DIAGNOSIS — S0501XA Injury of conjunctiva and corneal abrasion without foreign body, right eye, initial encounter: Secondary | ICD-10-CM

## 2024-01-18 MED ORDER — OFLOXACIN 0.3 % OP SOLN: 1.0000 [drp] | Freq: Four times a day (QID) | OPHTHALMIC | 0 refills | Status: AC

## 2024-01-18 NOTE — ED Provider Notes (Signed)
 [facility]    CSN: [id number] Arrival date & time: 01/18/24  1051      History   Chief Complaint No chief complaint on file.   HPI Guy Sutton is a 28 y.o. male presents for eye redness.  Patient reports 1 week of right eye irritation/redness with drainage, photosensitivity, itching and intermittent blurry vision.  Denies foreign body sensation or injury to the eye.  Denies eye pain.  Does not wear contacts or glasses.  No cough or cold/allergy symptoms.  He states he has had a scratch to his eye in the past and this feels similar.  He has been using clear eyes and Systane eyedrops with no improvement.  No other concerns at this time.  HPI  History reviewed. No pertinent past medical history.  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 No pertinent surgical history.     Home Medications    Prior to Admission medications   Medication Sig Start Date End Date Taking? Authorizing Provider  ofloxacin (OCUFLOX) 0.3 % ophthalmic solution Place 1 drop into the right eye 4 (four) times daily for 5 days. 01/18/24 01/23/24 Yes Loreda Myla SAUNDERS, NP    Family History Family History  Problem Relation Age of Onset   Healthy Mother    Healthy Father     Social History Social History   Tobacco Use   Smoking status: Never   Smokeless tobacco: Never  Vaping Use   Vaping status: Never Used  Substance Use Topics   Alcohol use: Not Currently   Drug use: No     Allergies   Patient has no known allergies.   Review of Systems Review of Systems  Eyes:  Positive for discharge, redness and itching.     Physical Exam Triage Vital Signs ED Triage Vitals  Encounter Vitals Group     BP 01/18/24 1101 (!) 141/90     Girls Systolic BP Percentile --      Girls Diastolic BP Percentile --      Boys Systolic BP Percentile --      Boys Diastolic BP Percentile --      Pulse  Rate 01/18/24 1101 66     Resp 01/18/24 1101 16     Temp 01/18/24 1101 98.5 F (36.9 C)     Temp Source 01/18/24 1101 Oral     SpO2 01/18/24 1101 96 %     Weight --      Height --      Head Circumference --      Peak Flow --      Pain Score 01/18/24 1059 1     Pain Loc --      Pain Education --      Exclude from Growth Chart --    No data found.  Updated Vital Signs BP (!) 141/90   Pulse 66   Temp 98.5 F (36.9 C) (Oral)   Resp 16   SpO2 96%   Visual Acuity Right Eye Distance: 20/70 Left Eye Distance: 20/20 Bilateral Distance: 20/70  Right Eye Near:   Left Eye Near:    Bilateral Near:     Physical Exam Vitals and nursing note reviewed.  Constitutional:      General: He is not in acute distress.    Appearance: Normal appearance. He is not ill-appearing.  HENT:     Head: Normocephalic and atraumatic.  Eyes:     General: Lids are normal.        Right eye: Discharge present. No foreign body or hordeolum.     Conjunctiva/sclera:     Right eye: Right conjunctiva is injected. No chemosis, exudate or hemorrhage.    Pupils:     Right eye: Corneal abrasion and fluorescein  uptake present.     Comments: Watery drainage from right eye   Cardiovascular:     Rate and Rhythm: Normal rate.  Pulmonary:     Effort: Pulmonary effort is normal.   Skin:    General: Skin is warm and dry.   Neurological:     General: No focal deficit present.     Mental Status: He is alert and oriented to person, place, and time.   Psychiatric:        Mood and Affect: Mood normal.        Behavior: Behavior normal.    Visual Acuity Bilateral Distance: 20/70R Distance: 20/70L Distance: 20/20 uncorrected  UC Treatments / Results  Labs (all labs ordered are listed, but only abnormal results are displayed) Labs Reviewed - No data to display  EKG   Radiology No results found.  Procedures Procedures (including critical care time)  Medications Ordered in UC Medications - No data  to display  Initial Impression / Assessment and Plan / UC Course  I have reviewed the triage vital signs and the nursing notes.  Pertinent labs & imaging results that were available during my care of the patient were reviewed by me and considered in my medical decision making (see chart for details).     Reviewed exam and symptoms with patient.  No red flags.  Positive uptake/corneal bridge in the right eye.  Start antibiotic eyedrops as prescribed.  Advised PCP or ophthalmology follow-up symptoms do not improve.  ER precautions reviewed. Final Clinical Impressions(s) / UC Diagnoses   Final diagnoses:  Abrasion of right cornea, initial encounter     Discharge Instructions      Start ofloxacin antibiotic eyedrops 4 times a day for 5 days.  Please follow-up with your PCP or ophthalmology if your symptoms do not improve.  Please go to the ER for any worsening symptoms.  Hope you feel better soon!    ED Prescriptions     Medication Sig Dispense Auth. Provider   ofloxacin (OCUFLOX) 0.3 % ophthalmic solution Place 1 drop into the right eye 4 (four) times daily for 5 days. 5 mL Damonta Cossey, Jodi R, NP      PDMP not reviewed this encounter.   Loreda Myla SAUNDERS, NP 01/18/24 [phone]

## 2024-01-18 NOTE — ED Triage Notes (Addendum)
 Pt c/o right irritation, blurred vision, photosensitivity in right eyex1wk. Pt states been using clear eyes eye drops and night tie severe dry eye relief and it has helped some. Pt's sclera of right eye is erythematous. Pt unable to open eye all the way

## 2024-01-18 NOTE — Discharge Instructions (Addendum)
 Start ofloxacin antibiotic eyedrops 4 times a day for 5 days.  Please follow-up with your PCP or ophthalmology if your symptoms do not improve.  Please go to the ER for any worsening symptoms.  Hope you feel better soon!
